# Patient Record
Sex: Female | Born: 1985 | Race: Black or African American | Hispanic: No | Marital: Single | Smoking: Never smoker
Health system: Southern US, Community
[De-identification: ages and names within clinical notes are randomized; demographics above are authoritative.]

## PROBLEM LIST (undated history)

## (undated) ENCOUNTER — Inpatient Hospital Stay (HOSPITAL_COMMUNITY): Payer: Self-pay

## (undated) DIAGNOSIS — I10 Essential (primary) hypertension: Secondary | ICD-10-CM

## (undated) DIAGNOSIS — Z8619 Personal history of other infectious and parasitic diseases: Secondary | ICD-10-CM

## (undated) DIAGNOSIS — O139 Gestational [pregnancy-induced] hypertension without significant proteinuria, unspecified trimester: Secondary | ICD-10-CM

## (undated) HISTORY — PX: NO PAST SURGERIES: SHX2092

## (undated) HISTORY — DX: Personal history of other infectious and parasitic diseases: Z86.19

---
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
## 2014-08-11 ENCOUNTER — Encounter (HOSPITAL_COMMUNITY): Payer: Self-pay | Admitting: Emergency Medicine

## 2014-08-11 ENCOUNTER — Emergency Department (HOSPITAL_COMMUNITY): Payer: Medicaid Other

## 2014-08-11 ENCOUNTER — Emergency Department (HOSPITAL_COMMUNITY)
Admission: EM | Admit: 2014-08-11 | Discharge: 2014-08-11 | Disposition: A | Discharge status: Home / Self Care | Payer: Medicaid Other | Attending: Emergency Medicine | Admitting: Emergency Medicine

## 2014-08-11 DIAGNOSIS — O200 Threatened abortion: Secondary | ICD-10-CM | POA: Diagnosis not present

## 2014-08-11 DIAGNOSIS — Z3A11 11 weeks gestation of pregnancy: Secondary | ICD-10-CM | POA: Diagnosis not present

## 2014-08-11 DIAGNOSIS — N390 Urinary tract infection, site not specified: Secondary | ICD-10-CM

## 2014-08-11 DIAGNOSIS — O2341 Unspecified infection of urinary tract in pregnancy, first trimester: Secondary | ICD-10-CM | POA: Diagnosis not present

## 2014-08-11 DIAGNOSIS — O10011 Pre-existing essential hypertension complicating pregnancy, first trimester: Secondary | ICD-10-CM | POA: Insufficient documentation

## 2014-08-11 DIAGNOSIS — O209 Hemorrhage in early pregnancy, unspecified: Secondary | ICD-10-CM | POA: Insufficient documentation

## 2014-08-11 DIAGNOSIS — N939 Abnormal uterine and vaginal bleeding, unspecified: Secondary | ICD-10-CM

## 2014-08-11 HISTORY — DX: Essential (primary) hypertension: I10

## 2014-08-11 LAB — CBC WITH DIFFERENTIAL/PLATELET
BASOS PCT: 0 % (ref 0–1)
Basophils Absolute: 0 10*3/uL (ref 0.0–0.1)
Eosinophils Absolute: 0.1 10*3/uL (ref 0.0–0.7)
Eosinophils Relative: 1 % (ref 0–5)
HCT: 38.4 % (ref 36.0–46.0)
Hemoglobin: 12.9 g/dL (ref 12.0–15.0)
LYMPHS PCT: 33 % (ref 12–46)
Lymphs Abs: 2.4 10*3/uL (ref 0.7–4.0)
MCH: 28.3 pg (ref 26.0–34.0)
MCHC: 33.6 g/dL (ref 30.0–36.0)
MCV: 84.2 fL (ref 78.0–100.0)
MONOS PCT: 9 % (ref 3–12)
Monocytes Absolute: 0.7 10*3/uL (ref 0.1–1.0)
NEUTROS ABS: 4.3 10*3/uL (ref 1.7–7.7)
NEUTROS PCT: 57 % (ref 43–77)
Platelets: 285 10*3/uL (ref 150–400)
RBC: 4.56 MIL/uL (ref 3.87–5.11)
RDW: 15.7 % — ABNORMAL HIGH (ref 11.5–15.5)
WBC: 7.4 10*3/uL (ref 4.0–10.5)

## 2014-08-11 LAB — COMPREHENSIVE METABOLIC PANEL
ALK PHOS: 38 U/L — AB (ref 39–117)
ALT: 56 U/L — ABNORMAL HIGH (ref 0–35)
ANION GAP: 10 (ref 5–15)
AST: 37 U/L (ref 0–37)
Albumin: 3.5 g/dL (ref 3.5–5.2)
BILIRUBIN TOTAL: 0.5 mg/dL (ref 0.3–1.2)
BUN: 9 mg/dL (ref 6–23)
CHLORIDE: 106 meq/L (ref 96–112)
CO2: 21 mmol/L (ref 19–32)
Calcium: 9.8 mg/dL (ref 8.4–10.5)
Creatinine, Ser: 0.71 mg/dL (ref 0.50–1.10)
GFR calc Af Amer: >90 mL/min (ref >90)
GFR calc non Af Amer: >90 mL/min (ref >90)
Glucose, Bld: 104 mg/dL — ABNORMAL HIGH (ref 70–99)
POTASSIUM: 3.7 mmol/L (ref 3.5–5.1)
Sodium: 137 mmol/L (ref 135–145)
Total Protein: 7.2 g/dL (ref 6.0–8.3)

## 2014-08-11 LAB — URINALYSIS, ROUTINE W REFLEX MICROSCOPIC
GLUCOSE, UA: NEGATIVE mg/dL
KETONES UR: 15 mg/dL — AB
NITRITE: NEGATIVE
Protein, ur: 100 mg/dL — AB
Specific Gravity, Urine: 1.028 (ref 1.005–1.030)
Urobilinogen, UA: 1 mg/dL (ref 0.0–1.0)
pH: 5.5 (ref 5.0–8.0)

## 2014-08-11 LAB — URINE MICROSCOPIC-ADD ON

## 2014-08-11 LAB — RAPID HIV SCREEN (WH-MAU): SUDS RAPID HIV SCREEN: NONREACTIVE

## 2014-08-11 LAB — WET PREP, GENITAL
Trich, Wet Prep: NONE SEEN
YEAST WET PREP: NONE SEEN

## 2014-08-11 LAB — ABO/RH: ABO/RH(D): A POS

## 2014-08-11 MED ORDER — CEPHALEXIN 500 MG PO CAPS: 500.0000 mg | ORAL_CAPSULE | Freq: Four times a day (QID) | ORAL | Status: DC

## 2014-08-11 NOTE — ED Provider Notes (Signed)
CSN: [id number]     Arrival date & time 08/11/14  1928 History   First MD Initiated Contact with Patient 08/11/14 2124     Chief Complaint  Patient presents with  . Vaginal Bleeding     (Consider location/radiation/quality/duration/timing/severity/associated sxs/prior Treatment) Patient is a 28 y.o. female presenting with vaginal bleeding. The history is provided by the patient and medical records.  Vaginal Bleeding   This is a 28 year old G1P0 approx [redacted] weeks gestation, presenting to the ED for vaginal bleeding.  Patient states bleeding began this evening and has been persistent since that time. She denies any passage of clots or membranous tissue. She denies any pelvic trauma. She has been taking prenatal vitamins, but has not had any formal prenatal care up until this point. She was seen at [facility] to have her Medicaid established, but has not yet seen an OB/GYN. She reports some intermittent nausea and vomiting, worse in the morning. States she has been having some issues with constipation due to the prenatal vitamins. She denies any fever or chills. No urinary symptoms. No abnormal vaginal discharge.  Past Medical History  Diagnosis Date  . Hypertension    History reviewed. No pertinent past surgical history. No family history on file. History  Substance Use Topics  . Smoking status: Never Smoker   . Smokeless tobacco: Not on file  . Alcohol Use: Yes   OB History    Gravida Para Term Preterm AB TAB SAB Ectopic Multiple Living   1              Review of Systems  Genitourinary: Positive for vaginal bleeding.  All other systems reviewed and are negative.     Allergies  Review of patient's allergies indicates no known allergies.  Home Medications   Prior to Admission medications   Not on File   BP 132/83 mmHg  Pulse 108  Temp(Src) 97.9 F (36.6 C) (Oral)  Resp 18  SpO2 100%  LMP 05/26/2014   Physical Exam  Constitutional: She is oriented to person,  place, and time. She appears well-developed and well-nourished.  HENT:  Head: Normocephalic and atraumatic.  Mouth/Throat: Oropharynx is clear and moist.  Eyes: Conjunctivae and EOM are normal. Pupils are equal, round, and reactive to light.  Neck: Normal range of motion.  Cardiovascular: Normal rate, regular rhythm and normal heart sounds.   Pulmonary/Chest: Effort normal and breath sounds normal. No respiratory distress. She has no wheezes.  Abdominal: Soft. Bowel sounds are normal. There is no tenderness. There is no guarding.  Genitourinary: There is bleeding in the vagina.  Normal female external genitalia without visible lesions, moderate amount of blood with small clots present in vaginal vault, cervical os remains closed, no adnexal or CMT  Musculoskeletal: Normal range of motion.  Neurological: She is alert and oriented to person, place, and time.  Skin: Skin is warm and dry.  Psychiatric: She has a normal mood and affect.  Nursing note and vitals reviewed.   ED Course  Procedures (including critical care time) Labs Review Labs Reviewed  WET PREP, GENITAL - Abnormal; Notable for the following:    Clue Cells Wet Prep HPF POC FEW (*)    WBC, Wet Prep HPF POC FEW (*)    All other components within normal limits  CBC WITH DIFFERENTIAL - Abnormal; Notable for the following:    RDW 15.7 (*)    All other components within normal limits  COMPREHENSIVE METABOLIC PANEL - Abnormal; Notable for the following:  Glucose, Bld 104 (*)    ALT 56 (*)    Alkaline Phosphatase 38 (*)    All other components within normal limits  URINALYSIS, ROUTINE W REFLEX MICROSCOPIC - Abnormal; Notable for the following:    Color, Urine RED (*)    APPearance CLOUDY (*)    Hgb urine dipstick LARGE (*)    Bilirubin Urine SMALL (*)    Ketones, ur 15 (*)    Protein, ur 100 (*)    Leukocytes, UA SMALL (*)    All other components within normal limits  URINE MICROSCOPIC-ADD ON - Abnormal; Notable for the  following:    Squamous Epithelial / LPF FEW (*)    Bacteria, UA MANY (*)    All other components within normal limits  GC/CHLAMYDIA PROBE AMP  URINE CULTURE  RAPID HIV SCREEN ([facility])  HCG, QUANTITATIVE, PREGNANCY  RPR  ABO/RH    Imaging Review [place] Ob Comp Less 14 Wks  08/11/2014   CLINICAL DATA:  Vaginal bleeding and first trimester pregnancy  EXAM: OBSTETRIC <14 WK US AND TRANSVAGINAL OB US  TECHNIQUE: Both transabdominal and transvaginal ultrasound examinations were performed for complete evaluation of the gestation as well as the maternal uterus, adnexal regions, and pelvic cul-de-sac. Transvaginal technique was performed to assess early pregnancy.  COMPARISON:  None.  FINDINGS: Intrauterine gestational sac: Visualized/normal in shape.  Yolk sac:  Present  Embryo:  Present  Cardiac Activity: Present  Heart Rate:  145 bpm  CRL:   41.9  mm   11 w 0 d                  US EDC: 03/02/2015  Maternal uterus/adnexae: The ovaries are symmetric in size and normal in appearance. No definitive/measurable sub chronic hemorrhage. No free pelvic fluid.  IMPRESSION: Single, living intrauterine gestation with estimated age 27 weeks. No definitive sub chronic hemorrhage.   Electronically Signed   By: Tiburcio PeaJonathan  Watts M.D.   On: 08/11/2014 23:24   [place] Ob Transvaginal  08/11/2014   CLINICAL DATA:  Vaginal bleeding and first trimester pregnancy  EXAM: OBSTETRIC <14 WK US AND TRANSVAGINAL OB US  TECHNIQUE: Both transabdominal and transvaginal ultrasound examinations were performed for complete evaluation of the gestation as well as the maternal uterus, adnexal regions, and pelvic cul-de-sac. Transvaginal technique was performed to assess early pregnancy.  COMPARISON:  None.  FINDINGS: Intrauterine gestational sac: Visualized/normal in shape.  Yolk sac:  Present  Embryo:  Present  Cardiac Activity: Present  Heart Rate:  145 bpm  CRL:   41.9  mm   11 w 0 d                  US EDC: 03/02/2015  Maternal uterus/adnexae:  The ovaries are symmetric in size and normal in appearance. No definitive/measurable sub chronic hemorrhage. No free pelvic fluid.  IMPRESSION: Single, living intrauterine gestation with estimated age 27 weeks. No definitive sub chronic hemorrhage.   Electronically Signed   By: Tiburcio PeaJonathan  Watts M.D.   On: 08/11/2014 23:24     EKG Interpretation None      MDM   Final diagnoses:  Vaginal bleeding  Threatened miscarriage  UTI (lower urinary tract infection)   28 year old G1 P0 approximately [redacted] weeks gestation with vaginal bleeding. No pelvic trauma. On pelvic exam, there is moderate amount of vaginal bleeding with small clots, cervical os remains closed. Lab work obtained as above-- UA appears infectious, culture pending.  Patient A+, not a candidate for rhogam.  U/s obtained-- single live IUP with + cardiac activity.  Patient reassured.  She will be started on keflex for UTI.  Encouraged pelvic rest for the next few days.  [facility]  Discussed plan with patient, he/she acknowledged understanding and agreed with plan of care.  Return precautions given for new or worsening symptoms.  Garlon HatchetLisa M Nancey Kreitz, PA-C 08/11/14 2359  Gilda Creasehristopher J. Pollina, MD 08/12/14 [phone]

## 2014-08-11 NOTE — ED Notes (Signed)
Pt. reports vaginal bleeding onset this evening , denies abdominal pain , pt. stated she is [redacted] weeks pregnant .

## 2014-08-11 NOTE — ED Notes (Signed)
Pt a/o x 4 on d/c with steady gait. 

## 2014-08-11 NOTE — ED Notes (Signed)
Pt reports bleeding and abd pain that started today. Pt rates pain 2/10. States she has bleeding like a menstrual period. Pt states she thinks she is [redacted]wks pregnant.

## 2014-08-11 NOTE — Discharge Instructions (Signed)
Take the prescribed medication as directed for UTI.  Pelvic rest for the next several days.  Monitor bleeding very closely. [facility]-- worsening bleeding, abdominal pain, high fever, etc.

## 2014-08-11 NOTE — ED Notes (Signed)
Patient transported to Ultrasound 

## 2014-08-12 LAB — GC/CHLAMYDIA PROBE AMP
CT Probe RNA: NEGATIVE
GC PROBE AMP APTIMA: NEGATIVE

## 2014-08-12 LAB — HCG, QUANTITATIVE, PREGNANCY: hCG, Beta Chain, Quant, S: 87153 m[IU]/mL — ABNORMAL HIGH (ref <5)

## 2014-08-12 LAB — RPR

## 2014-08-13 LAB — URINE CULTURE

## 2014-08-15 NOTE — L&D Delivery Note (Signed)
Delivery Note At 1:42 PM a viable female was delivered via  (Presentation: OA).  APGAR:8 , 9; weight pending .   Placenta status: delivered spontaneously.   Cord:  with the following complications: none .    Anesthesia: Epidural  Episiotomy:  none Lacerations:  Small first degree Suture Repair: 3.0 vicryl rapide for hemostasis Est. Blood Loss (mL):  250cc  Mom to postpartum.  Baby to Couplet care / Skin to Skin.  Oliver PilaICHARDSON,Sophiamarie Nease W 02/19/2015, 1:59 PM

## 2014-10-03 LAB — OB RESULTS CONSOLE ABO/RH: RH Type: POSITIVE

## 2014-10-03 LAB — OB RESULTS CONSOLE RUBELLA ANTIBODY, IGM: Rubella: IMMUNE

## 2014-10-03 LAB — OB RESULTS CONSOLE ANTIBODY SCREEN: Antibody Screen: NEGATIVE

## 2014-10-03 LAB — OB RESULTS CONSOLE GC/CHLAMYDIA
Chlamydia: NEGATIVE
Gonorrhea: NEGATIVE

## 2014-10-03 LAB — OB RESULTS CONSOLE HEPATITIS B SURFACE ANTIGEN: Hepatitis B Surface Ag: NEGATIVE

## 2014-10-03 LAB — OB RESULTS CONSOLE HIV ANTIBODY (ROUTINE TESTING): HIV: NONREACTIVE

## 2014-10-03 LAB — OB RESULTS CONSOLE RPR: RPR: NONREACTIVE

## 2015-01-30 ENCOUNTER — Encounter (HOSPITAL_COMMUNITY): Payer: Self-pay | Admitting: *Deleted

## 2015-01-30 ENCOUNTER — Inpatient Hospital Stay (HOSPITAL_COMMUNITY)
Admission: AD | Admit: 2015-01-30 | Discharge: 2015-01-30 | Disposition: A | Discharge status: Home / Self Care | Payer: Medicaid Other | Source: Ambulatory Visit | Attending: Obstetrics and Gynecology | Admitting: Obstetrics and Gynecology

## 2015-01-30 DIAGNOSIS — Z3A35 35 weeks gestation of pregnancy: Secondary | ICD-10-CM | POA: Diagnosis not present

## 2015-01-30 DIAGNOSIS — O133 Gestational [pregnancy-induced] hypertension without significant proteinuria, third trimester: Secondary | ICD-10-CM | POA: Diagnosis not present

## 2015-01-30 DIAGNOSIS — R030 Elevated blood-pressure reading, without diagnosis of hypertension: Secondary | ICD-10-CM | POA: Diagnosis present

## 2015-01-30 LAB — COMPREHENSIVE METABOLIC PANEL
ALT: 11 U/L — ABNORMAL LOW (ref 14–54)
AST: 17 U/L (ref 15–41)
Albumin: 2.6 g/dL — ABNORMAL LOW (ref 3.5–5.0)
Alkaline Phosphatase: 84 U/L (ref 38–126)
Anion gap: 6 (ref 5–15)
BUN: 8 mg/dL (ref 6–20)
CO2: 21 mmol/L — ABNORMAL LOW (ref 22–32)
Calcium: 9.1 mg/dL (ref 8.9–10.3)
Chloride: 107 mmol/L (ref 101–111)
Creatinine, Ser: 0.54 mg/dL (ref 0.44–1.00)
GFR calc Af Amer: >60 mL/min (ref >60)
GFR calc non Af Amer: >60 mL/min (ref >60)
Glucose, Bld: 116 mg/dL — ABNORMAL HIGH (ref 65–99)
Potassium: 3.9 mmol/L (ref 3.5–5.1)
Sodium: 134 mmol/L — ABNORMAL LOW (ref 135–145)
Total Bilirubin: 0.4 mg/dL (ref 0.3–1.2)
Total Protein: 6.4 g/dL — ABNORMAL LOW (ref 6.5–8.1)

## 2015-01-30 LAB — PROTEIN / CREATININE RATIO, URINE
Creatinine, Urine: 400 mg/dL
Protein Creatinine Ratio: 0.1 mg/mg{Cre} (ref 0.00–0.15)
Total Protein, Urine: 40 mg/dL

## 2015-01-30 LAB — CBC
HCT: 33.2 % — ABNORMAL LOW (ref 36.0–46.0)
Hemoglobin: 11.2 g/dL — ABNORMAL LOW (ref 12.0–15.0)
MCH: 28.9 pg (ref 26.0–34.0)
MCHC: 33.7 g/dL (ref 30.0–36.0)
MCV: 85.8 fL (ref 78.0–100.0)
Platelets: 260 10*3/uL (ref 150–400)
RBC: 3.87 MIL/uL (ref 3.87–5.11)
RDW: 14.1 % (ref 11.5–15.5)
WBC: 7.6 10*3/uL (ref 4.0–10.5)

## 2015-01-30 LAB — OB RESULTS CONSOLE GBS: GBS: NEGATIVE

## 2015-01-30 LAB — LACTATE DEHYDROGENASE: LDH: 98 U/L (ref 98–192)

## 2015-01-30 LAB — URIC ACID: Uric Acid, Serum: 4.5 mg/dL (ref 2.3–6.6)

## 2015-01-30 NOTE — MAU Note (Signed)
Pt sent from Dr's office, BP elevated. Denies HA, visual changes, epigastric pain or increase in swelling.  Pain in vag area, has been sore, exam was uncomfortable

## 2015-01-30 NOTE — Discharge Instructions (Signed)
Preeclampsia and Eclampsia °Preeclampsia is a serious condition that develops only during pregnancy. It is also called toxemia of pregnancy. This condition causes high blood pressure along with other symptoms, such as swelling and headaches. These may develop as the condition gets worse. Preeclampsia may occur 20 weeks or later into your pregnancy.  °Diagnosing and treating preeclampsia early is very important. If not treated early, it can cause serious problems for you and your baby. One problem it can lead to is eclampsia, which is a condition that causes muscle jerking or shaking (convulsions) in the mother. Delivering your baby is the best treatment for preeclampsia or eclampsia.  °RISK FACTORS °The cause of preeclampsia is not known. You may be more likely to develop preeclampsia if you have certain risk factors. These include:  °· Being pregnant for the first time. °· Having preeclampsia in a past pregnancy. °· Having a family history of preeclampsia. °· Having high blood pressure. °· Being pregnant with twins or triplets. °· Being 35 or older. °· Being African American. °· Having kidney disease or diabetes. °· Having medical conditions such as lupus or blood diseases. °· Being very overweight (obese). °SIGNS AND SYMPTOMS  °The earliest signs of preeclampsia are: °· High blood pressure. °· Increased protein in your urine. Your health care provider will check for this at every prenatal visit. °Other symptoms that can develop include:  °· Severe headaches. °· Sudden weight gain. °· Swelling of your hands, face, legs, and feet. °· Feeling sick to your stomach (nauseous) and throwing up (vomiting). °· Vision problems (blurred or double vision). °· Numbness in your face, arms, legs, and feet. °· Dizziness. °· Slurred speech. °· Sensitivity to bright lights. °· Abdominal pain. °DIAGNOSIS  °There are no screening tests for preeclampsia. Your health care provider will ask you about symptoms and check for signs of  preeclampsia during your prenatal visits. You may also have tests, including: °· Urine testing. °· Blood testing. °· Checking your baby's heart rate. °· Checking the health of your baby and your placenta using images created with sound waves (ultrasound). °TREATMENT  °You can work out the best treatment approach together with your health care provider. It is very important to keep all prenatal appointments. If you have an increased risk of preeclampsia, you may need more frequent prenatal exams. °· Your health care provider may prescribe bed rest. °· You may have to eat as little salt as possible. °· You may need to take medicine to lower your blood pressure if the condition does not respond to more conservative measures. °· You may need to stay in the hospital if your condition is severe. There, treatment will focus on controlling your blood pressure and fluid retention. You may also need to take medicine to prevent seizures. °· If the condition gets worse, your baby may need to be delivered early to protect you and the baby. You may have your labor started with medicine (be induced), or you may have a cesarean delivery. °· Preeclampsia usually goes away after the baby is born. °HOME CARE INSTRUCTIONS  °· Only take over-the-counter or prescription medicines as directed by your health care provider. °· Lie on your left side while resting. This keeps pressure off your baby. °· Elevate your feet while resting. °· Get regular exercise. Ask your health care provider what type of exercise is safe for you. °· Avoid caffeine and alcohol. °· Do not smoke. °· Drink 6-8 glasses of water every day. °· Eat a balanced diet   that is low in salt. Do not add salt to your food. °· Avoid stressful situations as much as possible. °· Get plenty of rest and sleep. °· Keep all prenatal appointments and tests as scheduled. °SEEK MEDICAL CARE IF: °· You are gaining more weight than expected. °· You have any headaches, abdominal pain, or  nausea. °· You are bruising more than usual. °· You feel dizzy or light-headed. °SEEK IMMEDIATE MEDICAL CARE IF:  °· You develop sudden or severe swelling anywhere in your body. This usually happens in the legs. °· You gain 5 lb (2.3 kg) or more in a week. °· You have a severe headache, dizziness, problems with your vision, or confusion. °· You have severe abdominal pain. °· You have lasting nausea or vomiting. °· You have a seizure. °· You have trouble moving any part of your body. °· You develop numbness in your body. °· You have trouble speaking. °· You have any abnormal bleeding. °· You develop a stiff neck. °· You pass out. °MAKE SURE YOU:  °· Understand these instructions. °· Will watch your condition. °· Will get help right away if you are not doing well or get worse. °Document Released: 07/29/2000 Document Revised: 08/06/2013 Document Reviewed: 05/24/2013 °ExitCare® Patient Information ©2015 [employer]. This information is not intended to replace advice given to you by your health care provider. Make sure you discuss any questions you have with your health care provider. ° ° ° °Hypertension During Pregnancy °Hypertension is also called high blood pressure. Blood pressure moves blood in your body. Sometimes, the force that moves the blood becomes too strong. When you are pregnant, this condition should be watched carefully. It can cause problems for you and your baby. °HOME CARE  °· Make and keep all of your doctor visits. °· Take medicine as told by your doctor. Tell your doctor about all medicines you take. °· Eat very little salt. °· Exercise regularly. °· Do not drink alcohol. °· Do not smoke. °· Do not have drinks with caffeine. °· Lie on your left side when resting. °· Your health care provider may ask you to take one low-dose aspirin (81mg) each day. °GET HELP RIGHT AWAY IF: °· You have bad belly (abdominal) pain. °· You have sudden puffiness (swelling) in the hands, ankles, or face. °· You gain 4  pounds (1.8 kilograms) or more in 1 week. °· You throw up (vomit) repeatedly. °· You have bleeding from the vagina. °· You do not feel the baby moving as much. °· You have a headache. °· You have blurred or double vision. °· You have muscle twitching or spasms. °· You have shortness of breath. °· You have blue fingernails and lips. °· You have blood in your pee (urine). °MAKE SURE YOU: °· Understand these instructions. °· Will watch your condition. °· Will get help right away if you are not doing well or get worse. °Document Released: 09/03/2010 Document Revised: 12/16/2013 Document Reviewed: 02/28/2013 °ExitCare® Patient Information ©2015 [employer]. This information is not intended to replace advice given to you by your health care provider. Make sure you discuss any questions you have with your health care provider. ° °

## 2015-01-30 NOTE — Progress Notes (Signed)
Patient refuses cath for urine sample.

## 2015-01-30 NOTE — MAU Note (Addendum)
Was seen at MD office and BP was elevated. Was sent to MAU for further evaluation. States she has not felt FM since 1130 today. C/O sharp pains in lower abdomen and suprapubic area with movement and change of position.

## 2015-01-30 NOTE — MAU Provider Note (Signed)
History     CSN: [id number]  Arrival date and time: 01/30/15 1314   First Provider Initiated Contact with Patient 01/30/15 1408      Chief Complaint  Patient presents with  . Hypertension   Hypertension This is a recurrent problem. The current episode started today. The problem is unchanged. Associated symptoms include anxiety and headaches. Pertinent negatives include no blurred vision or peripheral edema.  Headache  This is a recurrent problem. The current episode started 1 to 4 weeks ago. The problem occurs intermittently. The problem has been waxing and waning. The pain is located in the occipital region. The pain does not radiate. The pain quality is similar to prior headaches. The quality of the pain is described as dull. The pain is at a severity of 3/10. The pain is mild. Pertinent negatives include no abdominal pain, blurred vision, nausea, photophobia or vomiting. She has tried acetaminophen for the symptoms. The treatment provided no relief. There is no history of hypertension.    29 y.o.  G1P0  presents to the MAU from the office with elevated blood pressure. Here for preeclamptic evaluation.  Past Medical History  Diagnosis Date  . Hypertension     History reviewed. No pertinent past surgical history.  History reviewed. No pertinent family history.  History  Substance Use Topics  . Smoking status: Never Smoker   . Smokeless tobacco: Not on file  . Alcohol Use: Yes     Comment: occas. prior to preg.    Allergies: No Known Allergies  Prescriptions prior to admission  Medication Sig Dispense Refill Last Dose  . Prenatal Vit-Fe Fumarate-FA (PRENATAL MULTIVITAMIN) TABS tablet Take 1 tablet by mouth daily at 12 noon.   Past Week at Unknown time    Review of Systems  Eyes: Negative for blurred vision, double vision and photophobia.  Gastrointestinal: Negative for nausea, vomiting and abdominal pain.  Neurological: Positive for headaches.   Psychiatric/Behavioral: The patient is nervous/anxious.    Physical Exam   Blood pressure 149/96, pulse 119, temperature 98.2 F (36.8 C), temperature source Oral, resp. rate 18, last menstrual period 05/26/2014.  Physical Exam  Nursing note and vitals reviewed. Constitutional: She is oriented to person, place, and time. She appears well-developed and well-nourished. No distress.  HENT:  Head: Normocephalic.  Neck: Normal range of motion. No thyromegaly present.  Cardiovascular: Normal rate.   Respiratory: Effort normal. No respiratory distress.  GI: Soft. There is no tenderness.  Musculoskeletal: Normal range of motion. She exhibits no edema.  Neurological: She is alert and oriented to person, place, and time. She has normal reflexes. She displays normal reflexes.  Skin: Skin is warm and dry.  Psychiatric: She has a normal mood and affect. Her behavior is normal. Judgment and thought content normal.   Results for orders placed or performed during the hospital encounter of 01/30/15 (from the past 24 hour(s))  CBC     Status: Abnormal   Collection Time: 01/30/15  2:25 PM  Result Value Ref Range   WBC 7.6 4.0 - 10.5 K/uL   RBC 3.87 3.87 - 5.11 MIL/uL   Hemoglobin 11.2 (L) 12.0 - 15.0 g/dL   HCT 40.9 (L) 81.1 - 91.4 %   MCV 85.8 78.0 - 100.0 fL   MCH 28.9 26.0 - 34.0 pg   MCHC 33.7 30.0 - 36.0 g/dL   RDW 78.2 95.6 - 21.3 %   Platelets 260 150 - 400 K/uL  Comprehensive metabolic panel     Status: Abnormal  Collection Time: 01/30/15  2:25 PM  Result Value Ref Range   Sodium 134 (L) 135 - 145 mmol/L   Potassium 3.9 3.5 - 5.1 mmol/L   Chloride 107 101 - 111 mmol/L   CO2 21 (L) 22 - 32 mmol/L   Glucose, Bld 116 (H) 65 - 99 mg/dL   BUN 8 6 - 20 mg/dL   Creatinine, Ser 1.61 0.44 - 1.00 mg/dL   Calcium 9.1 8.9 - 09.6 mg/dL   Total Protein 6.4 (L) 6.5 - 8.1 g/dL   Albumin 2.6 (L) 3.5 - 5.0 g/dL   AST 17 15 - 41 U/L   ALT 11 (L) 14 - 54 U/L   Alkaline Phosphatase 84 38 - 126  U/L   Total Bilirubin 0.4 0.3 - 1.2 mg/dL   GFR calc non Af Amer >60 >60 mL/min   GFR calc Af Amer >60 >60 mL/min   Anion gap 6 5 - 15  Lactate dehydrogenase     Status: None   Collection Time: 01/30/15  2:25 PM  Result Value Ref Range   LDH 98 98 - 192 U/L  Uric acid     Status: None   Collection Time: 01/30/15  2:25 PM  Result Value Ref Range   Uric Acid, Serum 4.5 2.3 - 6.6 mg/dL  Protein / creatinine ratio, urine     Status: None   Collection Time: 01/30/15  2:35 PM  Result Value Ref Range   Creatinine, Urine 400.00 mg/dL   Total Protein, Urine 40 mg/dL   Protein Creatinine Ratio 0.10 0.00 - 0.15 mg/mg[Cre]   01/30/15 1541  --  115  --  --  138/88 mmHg  --  --  --  -- [place]     01/30/15 1531  --  111  --  --  141/89 mmHg  --  --  --  -- [place]    01/30/15 1521  --  113  --  --  140/89 mmHg  --  --  --  -- [place]    01/30/15 1511  --  108  --  --  150/96 mmHg  --  --  --  -- [place]    01/30/15 1501  --  105  --  --  143/93 mmHg  --  --  --  -- [place]    01/30/15 1451  --  116  --  --  136/89 mmHg  --  --  --  -- [place]    01/30/15 1441  --  114  --  --  149/95 mmHg  --  --  --  -- [place]    01/30/15 1421  --  119  --  --  140/96 mmHg  --  --  --  -- [place]    01/30/15 1411  --  120  --  --  151/96 mmHg  --  --  --  -- [place]    01/30/15 1401  --  119  --  --  149/96 mmHg  --  --  --  -- [place]    01/30/15 1359  98.2 F (36.8 C)  116  --  18  152/99 mmHg  --  --  --  -- [place]           MAU Course  Procedures  MDM Pt is here for serial blood pressures and labs. Pt has refused to have cath urine collected but will urinate in a cup. Spoke to Dr Ambrose Mantle and pt will be discharged but will  need to start biweekly testing. She will need to schedule an appointment next Monday or Tuesday. FHR reactive, not affected by current problem  Assessment and Plan  Gestational Hypertension  Discharge to home Pre E Precautions  Clemmons,Lori Grissett 01/30/2015, 2:11 PM

## 2015-02-13 ENCOUNTER — Encounter (HOSPITAL_COMMUNITY): Payer: Self-pay | Admitting: *Deleted

## 2015-02-13 ENCOUNTER — Telehealth (HOSPITAL_COMMUNITY): Payer: Self-pay | Admitting: *Deleted

## 2015-02-13 NOTE — Telephone Encounter (Signed)
Preadmission screen  

## 2015-02-16 ENCOUNTER — Inpatient Hospital Stay (HOSPITAL_COMMUNITY)
Admit: 2015-02-16 | Discharge: 2015-02-16 | Disposition: A | Discharge status: Home / Self Care | Payer: Medicaid Other | Source: Ambulatory Visit | Attending: Obstetrics and Gynecology | Admitting: Obstetrics and Gynecology

## 2015-02-16 DIAGNOSIS — Z3A38 38 weeks gestation of pregnancy: Secondary | ICD-10-CM | POA: Diagnosis not present

## 2015-02-16 DIAGNOSIS — O1493 Unspecified pre-eclampsia, third trimester: Secondary | ICD-10-CM | POA: Insufficient documentation

## 2015-02-16 NOTE — MAU Provider Note (Signed)
NST 130 R, good variability, category 1

## 2015-02-18 ENCOUNTER — Inpatient Hospital Stay (HOSPITAL_COMMUNITY): Payer: Medicaid Other | Admitting: Anesthesiology

## 2015-02-18 ENCOUNTER — Inpatient Hospital Stay (HOSPITAL_COMMUNITY)
Admission: AD | Admit: 2015-02-18 | Discharge: 2015-02-21 | DRG: 774 | Disposition: A | Discharge status: Home / Self Care | Payer: Medicaid Other | Source: Ambulatory Visit | Attending: Obstetrics and Gynecology | Admitting: Obstetrics and Gynecology

## 2015-02-18 ENCOUNTER — Encounter (HOSPITAL_COMMUNITY): Payer: Self-pay

## 2015-02-18 DIAGNOSIS — O99214 Obesity complicating childbirth: Secondary | ICD-10-CM | POA: Diagnosis present

## 2015-02-18 DIAGNOSIS — Z8249 Family history of ischemic heart disease and other diseases of the circulatory system: Secondary | ICD-10-CM | POA: Diagnosis not present

## 2015-02-18 DIAGNOSIS — O1493 Unspecified pre-eclampsia, third trimester: Principal | ICD-10-CM | POA: Diagnosis present

## 2015-02-18 DIAGNOSIS — Z3A38 38 weeks gestation of pregnancy: Secondary | ICD-10-CM | POA: Diagnosis present

## 2015-02-18 DIAGNOSIS — Z6836 Body mass index (BMI) 36.0-36.9, adult: Secondary | ICD-10-CM

## 2015-02-18 DIAGNOSIS — O1400 Mild to moderate pre-eclampsia, unspecified trimester: Secondary | ICD-10-CM | POA: Diagnosis present

## 2015-02-18 DIAGNOSIS — Z833 Family history of diabetes mellitus: Secondary | ICD-10-CM | POA: Diagnosis not present

## 2015-02-18 LAB — COMPREHENSIVE METABOLIC PANEL
ALK PHOS: 104 U/L (ref 38–126)
ALT: 12 U/L — ABNORMAL LOW (ref 14–54)
AST: 23 U/L (ref 15–41)
Albumin: 2.7 g/dL — ABNORMAL LOW (ref 3.5–5.0)
Anion gap: 6 (ref 5–15)
BUN: 13 mg/dL (ref 6–20)
CHLORIDE: 110 mmol/L (ref 101–111)
CO2: 19 mmol/L — ABNORMAL LOW (ref 22–32)
Calcium: 9 mg/dL (ref 8.9–10.3)
Creatinine, Ser: 0.58 mg/dL (ref 0.44–1.00)
GFR calc non Af Amer: >60 mL/min (ref >60)
GLUCOSE: 96 mg/dL (ref 65–99)
Potassium: 4.2 mmol/L (ref 3.5–5.1)
SODIUM: 135 mmol/L (ref 135–145)
TOTAL PROTEIN: 6.3 g/dL — AB (ref 6.5–8.1)
Total Bilirubin: 0.5 mg/dL (ref 0.3–1.2)

## 2015-02-18 LAB — ABO/RH: ABO/RH(D): A POS

## 2015-02-18 LAB — CBC
HCT: 34.1 % — ABNORMAL LOW (ref 36.0–46.0)
HEMOGLOBIN: 11.3 g/dL — AB (ref 12.0–15.0)
MCH: 28.8 pg (ref 26.0–34.0)
MCHC: 33.1 g/dL (ref 30.0–36.0)
MCV: 86.8 fL (ref 78.0–100.0)
Platelets: 258 10*3/uL (ref 150–400)
RBC: 3.93 MIL/uL (ref 3.87–5.11)
RDW: 15 % (ref 11.5–15.5)
WBC: 9.7 10*3/uL (ref 4.0–10.5)

## 2015-02-18 LAB — TYPE AND SCREEN
ABO/RH(D): A POS
Antibody Screen: NEGATIVE

## 2015-02-18 MED ORDER — OXYCODONE-ACETAMINOPHEN 5-325 MG PO TABS: 2.0000 | ORAL_TABLET | ORAL | Status: DC | PRN

## 2015-02-18 MED ORDER — FENTANYL 2.5 MCG/ML BUPIVACAINE 1/10 % EPIDURAL INFUSION (WH - ANES)
14.0000 mL/h | INTRAMUSCULAR | Status: DC | PRN
  Administered 2015-02-18 – 2015-02-19 (×2): 14 mL/h via EPIDURAL
  Administered 2015-02-19: 12 mL/h via EPIDURAL
  Filled 2015-02-18 (×2): qty 125

## 2015-02-18 MED ORDER — OXYTOCIN 40 UNITS IN LACTATED RINGERS INFUSION - SIMPLE MED: 62.5000 mL/h | INTRAVENOUS | Status: DC

## 2015-02-18 MED ORDER — OXYCODONE-ACETAMINOPHEN 5-325 MG PO TABS: 1.0000 | ORAL_TABLET | ORAL | Status: DC | PRN

## 2015-02-18 MED ORDER — EPHEDRINE 5 MG/ML INJ
10.0000 mg | INTRAVENOUS | Status: DC | PRN
  Filled 2015-02-18: qty 2

## 2015-02-18 MED ORDER — LIDOCAINE HCL (PF) 1 % IJ SOLN
30.0000 mL | INTRAMUSCULAR | Status: DC | PRN
  Filled 2015-02-18: qty 30

## 2015-02-18 MED ORDER — ACETAMINOPHEN 325 MG PO TABS: 650.0000 mg | ORAL_TABLET | ORAL | Status: DC | PRN

## 2015-02-18 MED ORDER — PHENYLEPHRINE 40 MCG/ML (10ML) SYRINGE FOR IV PUSH (FOR BLOOD PRESSURE SUPPORT)
80.0000 ug | PREFILLED_SYRINGE | INTRAVENOUS | Status: DC | PRN
  Filled 2015-02-18: qty 2

## 2015-02-18 MED ORDER — ONDANSETRON HCL 4 MG/2ML IJ SOLN: 4.0000 mg | Freq: Four times a day (QID) | INTRAMUSCULAR | Status: DC | PRN

## 2015-02-18 MED ORDER — LABETALOL HCL 5 MG/ML IV SOLN
INTRAVENOUS | Status: AC
  Administered 2015-02-18: 20 mg via INTRAVENOUS
  Filled 2015-02-18: qty 4

## 2015-02-18 MED ORDER — TERBUTALINE SULFATE 1 MG/ML IJ SOLN: 0.2500 mg | Freq: Once | INTRAMUSCULAR | Status: AC | PRN

## 2015-02-18 MED ORDER — OXYTOCIN BOLUS FROM INFUSION
500.0000 mL | INTRAVENOUS | Status: DC
  Administered 2015-02-19: 500 mL via INTRAVENOUS

## 2015-02-18 MED ORDER — PHENYLEPHRINE 40 MCG/ML (10ML) SYRINGE FOR IV PUSH (FOR BLOOD PRESSURE SUPPORT)
PREFILLED_SYRINGE | INTRAVENOUS | Status: AC
  Filled 2015-02-18: qty 20

## 2015-02-18 MED ORDER — BUTORPHANOL TARTRATE 1 MG/ML IJ SOLN: 1.0000 mg | INTRAMUSCULAR | Status: DC | PRN

## 2015-02-18 MED ORDER — FENTANYL 2.5 MCG/ML BUPIVACAINE 1/10 % EPIDURAL INFUSION (WH - ANES)
INTRAMUSCULAR | Status: AC
  Administered 2015-02-19: 12 mL/h via EPIDURAL
  Filled 2015-02-18: qty 125

## 2015-02-18 MED ORDER — LACTATED RINGERS IV SOLN: 500.0000 mL | INTRAVENOUS | Status: DC | PRN

## 2015-02-18 MED ORDER — LACTATED RINGERS IV SOLN
INTRAVENOUS | Status: DC
  Administered 2015-02-18: 125 mL/h via INTRAVENOUS
  Administered 2015-02-19: 01:00:00 via INTRAVENOUS
  Administered 2015-02-19: 125 mL/h via INTRAVENOUS

## 2015-02-18 MED ORDER — DIPHENHYDRAMINE HCL 50 MG/ML IJ SOLN: 12.5000 mg | INTRAMUSCULAR | Status: DC | PRN

## 2015-02-18 MED ORDER — OXYTOCIN 40 UNITS IN LACTATED RINGERS INFUSION - SIMPLE MED
1.0000 m[IU]/min | INTRAVENOUS | Status: DC
  Administered 2015-02-19: 2 m[IU]/min via INTRAVENOUS
  Filled 2015-02-18: qty 1000

## 2015-02-18 MED ORDER — CITRIC ACID-SODIUM CITRATE 334-500 MG/5ML PO SOLN: 30.0000 mL | ORAL | Status: DC | PRN

## 2015-02-18 MED ORDER — LABETALOL HCL 5 MG/ML IV SOLN
20.0000 mg | INTRAVENOUS | Status: DC | PRN
Start: 2015-02-18 — End: 2015-02-19
  Administered 2015-02-18: 20 mg via INTRAVENOUS

## 2015-02-18 NOTE — Progress Notes (Signed)
Assumed care of pt from liz Cone,RN pt sitting up for an epidural

## 2015-02-18 NOTE — H&P (Signed)
Cassandra Rich is a 29 y.o. female G1P0 at 5538 2/7 weeks (EDD7/18/16 by LMP c/w 11 week US) presenting for labor evaluation --pt is for induction of labor later this evening with an induction spot for midnight.   Prenatal care complicated by gestational hypertension beginning at 35 weeks.  She has been followed closely with labs and NST's.  An initial 24 hour urine done 2 weeks ago was normal at 224 mg protein in 24 hours.  However she repeated the urine 7/1 and it returned 449mg  protein/24 hours c/w mild preeclampsia.  BP has remained elevated at 130-150/70-90.  She has no PIH sx. Blood work and NST's have remained normal.   She started care late with our office at 17 weeks but otherwise had an uncomplicated course    Maternal Medical History:  Reason for admission: Contractions.   Contractions: Onset was 1-2 hours ago.   Frequency: regular.   Perceived severity is moderate.    Fetal activity: Perceived fetal activity is normal.    Prenatal complications: PIH.   Prenatal Complications - Diabetes: none.    OB History    Gravida Para Term Preterm AB TAB SAB Ectopic Multiple Living   1              Past Medical History  Diagnosis Date  . Hypertension   . Hx of chlamydia infection    Past Surgical History  Procedure Laterality Date  . No past surgeries     Family History: family history includes Diabetes in her father; Hypertension in her mother. Social History:  reports that she has never smoked. She has never used smokeless tobacco. She reports that she drinks alcohol. She reports that she does not use illicit drugs.   Prenatal Transfer Tool  Maternal Diabetes: No Genetic Screening: Normal Maternal Ultrasounds/Referrals: Normal Fetal Ultrasounds or other Referrals:  None Maternal Substance Abuse:  No Significant Maternal Medications:  None Significant Maternal Lab Results:  None Other Comments:  None  Review of Systems  Gastrointestinal: Negative for abdominal pain.   Neurological: Negative for headaches.      Last menstrual period 05/26/2014. Maternal Exam:  Uterine Assessment: Contraction strength is moderate.  Contraction frequency is regular.   Abdomen: Fetal presentation: vertex  Introitus: Normal vulva. Normal vagina.    Physical Exam  Constitutional: She appears well-developed.  Cardiovascular: Normal rate and regular rhythm.   Respiratory: Effort normal and breath sounds normal.  GI: Soft.  Genitourinary: Vagina normal.  Neurological: She is alert.  Psychiatric: She has a normal mood and affect.    Prenatal labs: ABO, Rh: A/Positive/-- (02/19 0000) Antibody: Negative (02/19 0000) Rubella: Immune (02/19 0000) RPR: Nonreactive (02/19 0000)  HBsAg: Negative (02/19 0000)  HIV: Non-reactive (02/19 0000)  GBS: Negative (06/17 0000)  One hour GTT 16 Three hour GTT WNL Tetra screen WNL CF negative  Assessment/Plan: Pt presents for labor evaluation and is for IOL later this PM for her mild preeclampsia.  WIll assess her cervical change and possibly admit early and augment.  Will recheck [facility] labs when admitted.  Oliver PilaICHARDSON,Cassandra Balfour W 02/18/2015, 8:07 PM

## 2015-02-18 NOTE — MAU Note (Signed)
Contractions every 5 mins. States some LOF, denies vag bleeding. +FM.

## 2015-02-19 ENCOUNTER — Inpatient Hospital Stay (HOSPITAL_COMMUNITY): Admission: RE | Admit: 2015-02-19 | Payer: Medicaid Other | Source: Ambulatory Visit

## 2015-02-19 ENCOUNTER — Encounter (HOSPITAL_COMMUNITY): Payer: Self-pay

## 2015-02-19 LAB — RPR: RPR Ser Ql: NONREACTIVE

## 2015-02-19 LAB — CBC WITH DIFFERENTIAL/PLATELET
BASOS ABS: 0 10*3/uL (ref 0.0–0.1)
Basophils Relative: 0 % (ref 0–1)
Eosinophils Absolute: 0 10*3/uL (ref 0.0–0.7)
Eosinophils Relative: 0 % (ref 0–5)
HEMATOCRIT: 34.2 % — AB (ref 36.0–46.0)
Hemoglobin: 11.4 g/dL — ABNORMAL LOW (ref 12.0–15.0)
LYMPHS PCT: 5 % — AB (ref 12–46)
Lymphs Abs: 0.9 10*3/uL (ref 0.7–4.0)
MCH: 28.9 pg (ref 26.0–34.0)
MCHC: 33.3 g/dL (ref 30.0–36.0)
MCV: 86.8 fL (ref 78.0–100.0)
Monocytes Absolute: 1.8 10*3/uL — ABNORMAL HIGH (ref 0.1–1.0)
Monocytes Relative: 9 % (ref 3–12)
NEUTROS ABS: 16.1 10*3/uL — AB (ref 1.7–7.7)
NEUTROS PCT: 86 % — AB (ref 43–77)
Platelets: 230 10*3/uL (ref 150–400)
RBC: 3.94 MIL/uL (ref 3.87–5.11)
RDW: 15.1 % (ref 11.5–15.5)
WBC: 18.8 10*3/uL — AB (ref 4.0–10.5)

## 2015-02-19 MED ORDER — ONDANSETRON HCL 4 MG/2ML IJ SOLN: 4.0000 mg | INTRAMUSCULAR | Status: DC | PRN

## 2015-02-19 MED ORDER — ACETAMINOPHEN 325 MG PO TABS: 650.0000 mg | ORAL_TABLET | ORAL | Status: DC | PRN

## 2015-02-19 MED ORDER — OXYCODONE-ACETAMINOPHEN 5-325 MG PO TABS
1.0000 | ORAL_TABLET | ORAL | Status: DC | PRN
  Administered 2015-02-19 – 2015-02-20 (×3): 1 via ORAL
  Filled 2015-02-19 (×3): qty 1

## 2015-02-19 MED ORDER — DIPHENHYDRAMINE HCL 25 MG PO CAPS: 25.0000 mg | ORAL_CAPSULE | Freq: Four times a day (QID) | ORAL | Status: DC | PRN

## 2015-02-19 MED ORDER — WITCH HAZEL-GLYCERIN EX PADS: 1.0000 "application " | MEDICATED_PAD | CUTANEOUS | Status: DC | PRN

## 2015-02-19 MED ORDER — PRENATAL MULTIVITAMIN CH
1.0000 | ORAL_TABLET | Freq: Every day | ORAL | Status: DC
  Administered 2015-02-20: 1 via ORAL
  Filled 2015-02-19: qty 1

## 2015-02-19 MED ORDER — TETANUS-DIPHTH-ACELL PERTUSSIS 5-2.5-18.5 LF-MCG/0.5 IM SUSP: 0.5000 mL | Freq: Once | INTRAMUSCULAR | Status: DC

## 2015-02-19 MED ORDER — BUPIVACAINE HCL (PF) 0.25 % IJ SOLN
INTRAMUSCULAR | Status: DC | PRN
  Administered 2015-02-18 (×2): 4 mL

## 2015-02-19 MED ORDER — ONDANSETRON HCL 4 MG PO TABS: 4.0000 mg | ORAL_TABLET | ORAL | Status: DC | PRN

## 2015-02-19 MED ORDER — BENZOCAINE-MENTHOL 20-0.5 % EX AERO: 1.0000 "application " | INHALATION_SPRAY | CUTANEOUS | Status: DC | PRN

## 2015-02-19 MED ORDER — LIDOCAINE-EPINEPHRINE (PF) 2 %-1:200000 IJ SOLN
INTRAMUSCULAR | Status: DC | PRN
  Administered 2015-02-18: 3 mL

## 2015-02-19 MED ORDER — IBUPROFEN 600 MG PO TABS
600.0000 mg | ORAL_TABLET | Freq: Four times a day (QID) | ORAL | Status: DC
  Administered 2015-02-19 – 2015-02-21 (×6): 600 mg via ORAL
  Filled 2015-02-19 (×7): qty 1

## 2015-02-19 MED ORDER — ZOLPIDEM TARTRATE 5 MG PO TABS: 5.0000 mg | ORAL_TABLET | Freq: Every evening | ORAL | Status: DC | PRN

## 2015-02-19 MED ORDER — DIBUCAINE 1 % RE OINT: 1.0000 "application " | TOPICAL_OINTMENT | RECTAL | Status: DC | PRN

## 2015-02-19 MED ORDER — OXYCODONE-ACETAMINOPHEN 5-325 MG PO TABS: 2.0000 | ORAL_TABLET | ORAL | Status: DC | PRN

## 2015-02-19 MED ORDER — SENNOSIDES-DOCUSATE SODIUM 8.6-50 MG PO TABS
2.0000 | ORAL_TABLET | ORAL | Status: DC
  Administered 2015-02-19 – 2015-02-21 (×2): 2 via ORAL
  Filled 2015-02-19 (×2): qty 2

## 2015-02-19 MED ORDER — LANOLIN HYDROUS EX OINT: TOPICAL_OINTMENT | CUTANEOUS | Status: DC | PRN

## 2015-02-19 MED ORDER — SIMETHICONE 80 MG PO CHEW: 80.0000 mg | CHEWABLE_TABLET | ORAL | Status: DC | PRN

## 2015-02-19 NOTE — Plan of Care (Signed)
Problem: Consults Goal: Birthing Suites Patient Information Press F2 to bring up selections list  Outcome: Completed/Met Date Met:  02/19/15  Pt 37-[redacted] weeks EGA and Inpatient induction, preeclampsia

## 2015-02-19 NOTE — Anesthesia Preprocedure Evaluation (Signed)
Anesthesia Evaluation  Patient identified by MRN, date of birth, ID band Patient awake    Reviewed: Allergy & Precautions, Patient's Chart, lab work & pertinent test results  History of Anesthesia Complications Negative for: history of anesthetic complications  Airway Mallampati: III  TM Distance: >3 FB Neck ROM: Full    Dental  (+) Teeth Intact   Pulmonary neg pulmonary ROS,  breath sounds clear to auscultation        Cardiovascular hypertension, Pt. on medications Rhythm:Regular     Neuro/Psych negative neurological ROS  negative psych ROS   GI/Hepatic negative GI ROS, Neg liver ROS,   Endo/Other  Morbid obesity  Renal/GU negative Renal ROS     Musculoskeletal   Abdominal   Peds  Hematology  (+) anemia ,   Anesthesia Other Findings   Reproductive/Obstetrics (+) Pregnancy                             Anesthesia Physical Anesthesia Plan  ASA: II  Anesthesia Plan: Epidural   Post-op Pain Management:    Induction:   Airway Management Planned:   Additional Equipment:   Intra-op Plan:   Post-operative Plan:   Informed Consent: I have reviewed the patients History and Physical, chart, labs and discussed the procedure including the risks, benefits and alternatives for the proposed anesthesia with the patient or authorized representative who has indicated his/her understanding and acceptance.   Dental advisory given  Plan Discussed with: Anesthesiologist  Anesthesia Plan Comments:         Anesthesia Quick Evaluation

## 2015-02-19 NOTE — Progress Notes (Signed)
Patient ID: Cassandra Rich, female   DOB: 12/28/1985, 28 y.o.   MRN: [id number] Pt was admitted a little before the anticipated time because she was having painful contractions. Her BP was as high as 185/102 and she was given 20 mg labetalol. She received an epidural and now her BP is normal. Her contractions are slightly less than 2 minutes apart even though she has received no uterotonic agents. She was examined by the RN at 12:10 AM after having SROM at 11;30 pm Her cervix was 2 cm 90% and the vertex was at -2 station.

## 2015-02-19 NOTE — Progress Notes (Signed)
Patient ID: Cassandra Rich, female   DOB: 06/13/1986, 29 y.o.   MRN: [id number] Pt doing well, reached complete dilation and pushing   afeb VSS  FHR overall reassuring   C/c/+1  Continue pushing

## 2015-02-19 NOTE — Lactation Note (Signed)
This note was copied from the chart of Cassandra Rich. Lactation Consultation Note  When I walked into the room mother was in a corner in the dark with her baby attached.  She was wincing, and I could hear that she was in pain.  In the first 6 hours of life her nipples have become traumatized for an incorrect latch.  Baby has been chewing at the breast.  We detached the baby and relatched her but mother was in too much pain to continue the feeding.  She requested formula.  Risks of formula explained to mom.  I talked to her about hand expressing and spoon feeding and she agreed to this.  Mom was taught hand expression with colostrum easily expressible.  Baby ate about 3 ml of expressed breastmilk.  I reviewed how to use the hand pump and changed the flange size.  She will use a #27 on the L and a #30 on the R.  Mom started using comfort gels.  Baby has a frenum under her tongue that causes the tongue to retract with extension.  I suspect this is contributing to mom's pain.  I did not discuss findings with her at this time.  Follow-up tomorrow.  Has information on support groups and outpatient services.  Patient Name: Cassandra Rich Today's Date: 02/19/2015 Reason for consult: Initial assessment   Maternal Data Has patient been taught Hand Expression?: Yes Does the patient have breastfeeding experience prior to this delivery?: No  Feeding Feeding Type: Breast Fed Length of feed: 15 min  LATCH Score/Interventions Latch: Repeated attempts needed to sustain latch, nipple held in mouth throughout feeding, stimulation needed to elicit sucking reflex.  Audible Swallowing: None Intervention(s): Skin to skin;Hand expression  Type of Nipple: Everted at rest and after stimulation  Comfort (Breast/Nipple): Engorged, cracked, bleeding, large blisters, severe discomfort  Problem noted: Cracked, bleeding, blisters, bruises;Severe discomfort Interventions   (Cracked/bleeding/bruising/blister): Expressed breast milk to nipple;Hand pump Interventions (Mild/moderate discomfort): Hand massage;Hand expression;Comfort gels Interventions (Severe discomfort): Flange size  Hold (Positioning): Assistance needed to correctly position infant at breast and maintain latch. Intervention(s): Breastfeeding basics reviewed;Skin to skin  LATCH Score: 4  Lactation Tools Discussed/Used Initiated by:: RN Date initiated:: 02/19/15   Consult Status Consult Status: Follow-up Date: 02/20/15 Follow-up type: In-patient    Soyla DryerJoseph, Afton Lavalle 02/19/2015, 9:42 PM

## 2015-02-19 NOTE — Plan of Care (Signed)
Problem: Consults Goal: Birthing Suites Patient Information Press F2 to bring up selections list   Pt 37-[redacted] weeks EGA and Inpatient induction, preeclampsia

## 2015-02-19 NOTE — Progress Notes (Signed)
Patient ID: Cassandra Rich, female   DOB: 04/21/1986, 29 y.o.   MRN: [id number] Pt has continued to labor with some pitocin augmentation.  Comfortable with epidural BP normal with epidural Cervix 5-6/c/0 IUPC placed to better monitor contractions  FHR overall reassuring, some early mild variables and good scalp stim  Continue to follow progress.

## 2015-02-19 NOTE — Anesthesia Procedure Notes (Signed)
Epidural Patient location during procedure: OB  Staffing Anesthesiologist: Lachlyn Vanderstelt, CHRIS Performed by: anesthesiologist   Preanesthetic Checklist Completed: patient identified, surgical consent, pre-op evaluation, timeout performed, IV checked, risks and benefits discussed and monitors and equipment checked  Epidural Patient position: sitting Prep: site prepped and draped and DuraPrep Patient monitoring: heart rate, cardiac monitor, continuous pulse ox and blood pressure Approach: midline Location: L3-L4 Injection technique: LOR saline  Needle:  Needle type: Tuohy  Needle gauge: 17 G Needle length: 9 cm Needle insertion depth: 8 cm Catheter type: closed end flexible Catheter size: 19 Gauge Catheter at skin depth: 13 cm Test dose: negative and 2% lidocaine with Epi 1:200 K  Assessment Events: blood not aspirated, injection not painful, no injection resistance, negative IV test and no paresthesia  Additional Notes H+P and labs checked, risks and benefits discussed with the patient, consent obtained, procedure tolerated well and without complications.  Reason for block:procedure for pain   

## 2015-02-20 LAB — CBC
HCT: 32.6 % — ABNORMAL LOW (ref 36.0–46.0)
HEMOGLOBIN: 10.7 g/dL — AB (ref 12.0–15.0)
MCH: 28.8 pg (ref 26.0–34.0)
MCHC: 32.8 g/dL (ref 30.0–36.0)
MCV: 87.6 fL (ref 78.0–100.0)
PLATELETS: 240 10*3/uL (ref 150–400)
RBC: 3.72 MIL/uL — ABNORMAL LOW (ref 3.87–5.11)
RDW: 15.3 % (ref 11.5–15.5)
WBC: 22.1 10*3/uL — ABNORMAL HIGH (ref 4.0–10.5)

## 2015-02-20 NOTE — Lactation Note (Addendum)
This note was copied from the chart of Cassandra EritreaVictoria Gowans. Lactation Consultation Note  Patient Name: Cassandra Rich Today's Date: 02/20/2015 Reason for consult: Follow-up assessment;Difficult latch (sore nipples.) Baby 22 hours old. Mom reports that her nipples are very sensitive/sore, but she has been using EBM and comfort gels, and pumping instead of putting baby to breast since yesterday. Assisted mom to latch baby to left breast in football position. Mom wincing before baby even latched at breast. Baby able to latch deeply, started suckling rhythmically with lips flanged, but mom physically withdrawing from baby. Attempted a second time to latch baby, but mom stating she cannot tolerate the pain. Mom's nipples appear intact and no redness or abrasion noted. Enc mom to continue using EBM and colostrum on nipples. Assisted mom to hand express 4 mls of colostrum which were given to baby with spoon. Baby given an additional 6 mls of formula with spoon as well. Enc mom to increase supplemental amounts following guidelines. Handed mom her pump parts to begin pumping and enc mom to use EBM for next feeding. Enc mom to keep attempting to latch baby directly at breast as able, and to offer lots of STS. Discussed that as her milk comes in--comes to volume, baby's mouth may relax and not cause discomfort while baby nursing. Enc mom to pump after each feeding and discussed recommendations for exclusive breast feeding 6 months, and then in addition to supplemental food for at least 2 years. Enc mom to call for assistance as needed.   Maternal Data    Feeding Feeding Type: Breast Fed Length of feed: 2 min  LATCH Score/Interventions Latch: Grasps breast easily, tongue down, lips flanged, rhythmical sucking. Intervention(s): Adjust position;Assist with latch;Breast compression  Audible Swallowing: None Intervention(s): Skin to skin;Hand expression  Type of Nipple: Everted at rest and after  stimulation  Comfort (Breast/Nipple): Engorged, cracked, bleeding, large blisters, severe discomfort  Problem noted: Severe discomfort Interventions  (Cracked/bleeding/bruising/blister): Expressed breast milk to nipple Interventions (Mild/moderate discomfort): Hand massage;Hand expression;Comfort gels;Post-pump Interventions (Severe discomfort): Double electric pum;Observe pumping  Hold (Positioning): Assistance needed to correctly position infant at breast and maintain latch. Intervention(s): Breastfeeding basics reviewed;Support Pillows;Position options;Skin to skin  LATCH Score: 5  Lactation Tools Discussed/Used Tools: Pump;Comfort gels (spoons.) Breast pump type: Double-Electric Breast Pump   Consult Status Consult Status: Follow-up Date: 02/21/15 Follow-up type: In-patient    Cassandra Rich, [name] 02/20/2015, 12:41 PM

## 2015-02-20 NOTE — Progress Notes (Signed)
Post Partum Day 1 Subjective: no complaints and tolerating PO  Objective: Blood pressure 145/92, pulse 101, temperature 97.6 F (36.4 C), temperature source Oral, resp. rate 18, height 5\' 7"  (1.702 m), weight 106.595 kg (235 lb), last menstrual period 05/26/2014, SpO2 98 %, unknown if currently breastfeeding.  Physical Exam:  General: alert and cooperative Lochia: appropriate Uterine Fundus: firm    Recent Labs  02/19/15 1458 02/20/15 0508  HGB 11.4* 10.7*  HCT 34.2* 32.6*    Assessment/Plan: Plan for discharge tomorrow  BP still borderline, not high enough to treat so will follow   LOS: 2 days   Cassandra Rich W 02/20/2015, 1:35 PM

## 2015-02-20 NOTE — Progress Notes (Signed)
PT having severe lower back pain at epidural insertion site 9/10 pain. Anesthesiologist was called and assessed pt. Believes pt has bruising and inflammation around site and will assess her again in the morning. No complaints from the pt at this time.  Loran Fleet W Jailin Manocchio, RN

## 2015-02-20 NOTE — Progress Notes (Signed)
Pt requested mirilax

## 2015-02-20 NOTE — Anesthesia Postprocedure Evaluation (Signed)
  Anesthesia Post-op Note  Patient: Cassandra Rich  Procedure(s) Performed: * No procedures listed *  Patient Location: PACU and Mother/Baby  Anesthesia Type:Epidural  Level of Consciousness: awake, alert  and oriented  Airway and Oxygen Therapy: Patient Spontanous Breathing  Post-op Pain: none  Post-op Assessment: Post-op Vital signs reviewed, Patient's Cardiovascular Status Stable, No headache, No backache, No residual numbness and No residual motor weakness  Post-op Vital Signs: Reviewed and stable  Complications: No apparent anesthesia complications

## 2015-02-21 MED ORDER — IBUPROFEN 600 MG PO TABS: 600.0000 mg | ORAL_TABLET | Freq: Four times a day (QID) | ORAL | Status: AC

## 2015-02-21 MED ORDER — POLYETHYLENE GLYCOL 3350 17 G PO PACK
17.0000 g | PACK | Freq: Once | ORAL | Status: DC
  Filled 2015-02-21: qty 1

## 2015-02-21 NOTE — Progress Notes (Signed)
PPD #2 C/o constipation-wants Miralax, pain and numbness in hands, swelling in feet Afeb, VSS Fundus firm Will Rx Miralax prior to d/c, wrist splints for temporary CTS

## 2015-02-21 NOTE — Progress Notes (Signed)
CLINICAL SOCIAL WORK MATERNAL/CHILD NOTE  Patient Details  Name: Cassandra Rich MRN: [id number] Date of Birth: 06/05/2014  Date:  02/21/2015  Clinical Social Worker Initiating Note:  Loleta BooksSarah Marcellene Shivley, LCSW Date/ Time Initiated:  02/21/15/0950     Child's Name:  Cassandra Rich   Legal Guardian:  Mother: [facility] Rich   Need for Interpreter:  None   Date of Referral:  02/21/15     Reason for Referral:  History of bipolar  [facility]   Address:  [address] [place], [place] [address]  Phone number:  [phone]   Household Members:  Self   Natural Supports (not living in the home):  Extended Family, Friends, Immediate Family   Professional Supports: None   Employment: Full-time   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 She stated that he is now currently in jail.  [name] discussed that she is no longer in a relationship with him, and is continuing to "move on" instead of dwelling on the past. 2)Mental Health Concerns : [name] presents with history of bipolar. Per [name], received diagnosis as a teenager, but denied any symptoms since high school. [name] reported relational stress with FOB contributed to feelings of sadness early in the pregnancy, but denied presence of additional symptoms.   Cognitive State:  Able to Concentrate , Alert , Goal Oriented , Linear Thinking    Mood/Affect:  Bright , Happy , Interested , Calm , Comfortable    CSW Assessment:  CSW received request for consult due to [name] presenting with a history of bipolar.  CSW spoke with  RN who denied presence of any symptoms while on Mother Baby Unit.  [name] presented as easily engaged and receptive to the visit. She was noted to be in a pleasant mood, and displayed a full range in affect.  [name] attended to the infant during the visit, and was noted to smile brightly when interacting with her. [name] did not present with any acute mental health symptoms and appeared comfortable interacting and caring for the infant.   [name] endorsed readiness and eagerness for discharge. She stated that she is looking forward to returning home and adjusting to her new "normal".  [name] shared that it continues to feel surreal that the infant has finally been born, but she discussed feelings of happiness and excited secondary to role transition to motherhood.  [name] discussed having the home prepared for the infant and a strong support system. [name] shared that she [place] have any family in [place], but they all have plans to travel in from [place] to provide her with support.  [name] discussed how she will continue to have support once her family leaves since she has numerous friends who are actively involved in her life.  [name] denied questions, concerns, or needs as she transitions to the postpartum period.   Per [name], mental health history significant for bipolar. She reported that she received diagnosis as a teenager and was prescribed medications, but she shared that she did not remain on medications for long due to how they made  her feel. [name] stated that during high school she had intense mood swings and became easily agitated and frustration.  [name] denied any concerns regarding these symptoms since high school since she has learned how to self-regulate and increase her tolerance levels. [name] presented as receptive to exploring how these skills can be transferred to parenting. [name] reported feeling of sadness early in the pregnancy due to stress with the FOB. She stated that she learned that he had been unfaithful and then  he is ended up in jail.  [name] shared that she is no longer in a relationship with him, and only remains in contact with him to inform him of updates related to the infant.  [name] denied that this relationship is an ongoing stressor, and discussed her efforts to be accept the past instead of dwelling on it.  As [name] transitions to postpartum, she denied any notable anxiety or depression.  [name] shared that she does feel a need to watch the infant when she is sleeping out of fear that something bad will happen if she is not watching, but she discussed how her mother has already talked with her about the importance of getting sleep.    [name] presented as attentive and engaged as CSW provided education on perinatal mood and anxiety disorders. She denied questions or concerns related to the information, and acknowledged increased risk due to mental health history. [name] agreed to contact her medical provider if she notes onset of symptoms.   CSW Plan/Description:   1)Patient/Family Education: Perinatal mood and anxiety disorders. [name] to contact her OB if she notes onset of symptoms.  2)No Further Intervention Required/No Barriers to Discharge    Sears Oran N, LCSW 02/21/2015, 10:26 AM  

## 2015-02-21 NOTE — Lactation Note (Signed)
This note was copied from the chart of Cassandra Rich. Lactation Consultation Note; Mom has been bottle feeding EBM and formula because her nipples are so sore. Reports her breasts are feeling a little heavier this morning. Reports she pumped 3 times yesterday and is obtained 5-7 cc's. Encouraged to pump every time baby feeds to promote a good milk supply and prevent engorgement. Has manual pump for home. Suggested trying to latch baby again as nipples heal. No questions at present. To call prn  Patient Name: Cassandra Genevie AnnVictoria Gorman Today's Date: 02/21/2015 Reason for consult: Follow-up assessment   Maternal Data Formula Feeding for Exclusion: Yes Reason for exclusion: Mother's choice to formula and breast feed on admission Has patient been taught Hand Expression?: Yes Does the patient have breastfeeding experience prior to this delivery?: No  Feeding Feeding Type: Formula Nipple Type: Slow - flow  LATCH Score/Interventions                      Lactation Tools Discussed/Used     Consult Status Consult Status: Complete    Pamelia HoitWeeks, Muslima Toppins D 02/21/2015, 9:37 AM

## 2015-02-21 NOTE — Discharge Summary (Signed)
Obstetric Discharge Summary Reason for Admission: onset of labor Prenatal Procedures: NST and Preeclampsia Intrapartum Procedures: spontaneous vaginal delivery Postpartum Procedures: none Complications-Operative and Postpartum: 1st degree perineal laceration HEMOGLOBIN  Date Value Ref Range Status  02/20/2015 10.7* 12.0 - 15.0 g/dL Final   HCT  Date Value Ref Range Status  02/20/2015 32.6* 36.0 - 46.0 % Final    Physical Exam:  General: alert Lochia: appropriate Uterine Fundus: firm   Discharge Diagnoses: Term Pregnancy-delivered and Preelampsia  Discharge Information: Date: 02/21/2015 Activity: pelvic rest Diet: routine Medications: Ibuprofen Condition: stable Instructions: refer to practice specific booklet Discharge to: home Follow-up Information    Follow up with Oliver PilaICHARDSON,KATHY W, MD. Schedule an appointment as soon as possible for a visit in 6 weeks.   Specialty:  Obstetrics and Gynecology   Contact information:   [address] [place] [address] [phone]       Newborn Data: Live born female  Birth Weight: 6 lb 15.6 oz (3164 g) APGAR: 8, 9  Home with mother.  Cassandra Rich D 02/21/2015, 8:59 AM

## 2015-02-21 NOTE — Discharge Instructions (Signed)
As per discharge pamphlet °

## 2015-02-25 ENCOUNTER — Inpatient Hospital Stay (HOSPITAL_COMMUNITY)
Admission: AD | Admit: 2015-02-25 | Discharge: 2015-02-27 | DRG: 776 | Disposition: A | Discharge status: Home / Self Care | Payer: Medicaid Other | Source: Ambulatory Visit | Attending: Obstetrics and Gynecology | Admitting: Obstetrics and Gynecology

## 2015-02-25 ENCOUNTER — Encounter (HOSPITAL_COMMUNITY): Payer: Self-pay | Admitting: *Deleted

## 2015-02-25 DIAGNOSIS — I158 Other secondary hypertension: Secondary | ICD-10-CM | POA: Diagnosis present

## 2015-02-25 DIAGNOSIS — O9089 Other complications of the puerperium, not elsewhere classified: Principal | ICD-10-CM | POA: Diagnosis present

## 2015-02-25 DIAGNOSIS — R030 Elevated blood-pressure reading, without diagnosis of hypertension: Secondary | ICD-10-CM | POA: Diagnosis present

## 2015-02-25 DIAGNOSIS — O1495 Unspecified pre-eclampsia, complicating the puerperium: Secondary | ICD-10-CM | POA: Diagnosis present

## 2015-02-25 HISTORY — DX: Gestational (pregnancy-induced) hypertension without significant proteinuria, unspecified trimester: O13.9

## 2015-02-25 LAB — URINE MICROSCOPIC-ADD ON

## 2015-02-25 LAB — COMPREHENSIVE METABOLIC PANEL
ALK PHOS: 72 U/L (ref 38–126)
ALT: 35 U/L (ref 14–54)
AST: 23 U/L (ref 15–41)
Albumin: 2.9 g/dL — ABNORMAL LOW (ref 3.5–5.0)
Anion gap: 6 (ref 5–15)
BUN: 13 mg/dL (ref 6–20)
CO2: 24 mmol/L (ref 22–32)
CREATININE: 0.79 mg/dL (ref 0.44–1.00)
Calcium: 8.7 mg/dL — ABNORMAL LOW (ref 8.9–10.3)
Chloride: 108 mmol/L (ref 101–111)
GFR calc non Af Amer: >60 mL/min (ref >60)
Glucose, Bld: 100 mg/dL — ABNORMAL HIGH (ref 65–99)
Potassium: 3.8 mmol/L (ref 3.5–5.1)
SODIUM: 138 mmol/L (ref 135–145)
Total Bilirubin: 0.4 mg/dL (ref 0.3–1.2)
Total Protein: 6.7 g/dL (ref 6.5–8.1)

## 2015-02-25 LAB — CBC
HCT: 31.1 % — ABNORMAL LOW (ref 36.0–46.0)
HEMOGLOBIN: 10.1 g/dL — AB (ref 12.0–15.0)
MCH: 28.6 pg (ref 26.0–34.0)
MCHC: 32.5 g/dL (ref 30.0–36.0)
MCV: 88.1 fL (ref 78.0–100.0)
PLATELETS: 326 10*3/uL (ref 150–400)
RBC: 3.53 MIL/uL — AB (ref 3.87–5.11)
RDW: 15.7 % — AB (ref 11.5–15.5)
WBC: 9.5 10*3/uL (ref 4.0–10.5)

## 2015-02-25 LAB — PROTEIN / CREATININE RATIO, URINE
Creatinine, Urine: 331 mg/dL
Protein Creatinine Ratio: 0.16 mg/mg{Cre} — ABNORMAL HIGH (ref 0.00–0.15)
TOTAL PROTEIN, URINE: 54 mg/dL

## 2015-02-25 LAB — URINALYSIS, ROUTINE W REFLEX MICROSCOPIC
Bilirubin Urine: NEGATIVE
Glucose, UA: NEGATIVE mg/dL
KETONES UR: NEGATIVE mg/dL
NITRITE: NEGATIVE
Protein, ur: 30 mg/dL — AB
Specific Gravity, Urine: >1.03 — ABNORMAL HIGH (ref 1.005–1.030)
UROBILINOGEN UA: 1 mg/dL (ref 0.0–1.0)
pH: 6 (ref 5.0–8.0)

## 2015-02-25 MED ORDER — OXYCODONE-ACETAMINOPHEN 5-325 MG PO TABS: 1.0000 | ORAL_TABLET | Freq: Four times a day (QID) | ORAL | Status: DC | PRN

## 2015-02-25 MED ORDER — LABETALOL HCL 5 MG/ML IV SOLN
20.0000 mg | Freq: Once | INTRAVENOUS | Status: AC
  Administered 2015-02-25: 20 mg via INTRAVENOUS

## 2015-02-25 MED ORDER — LABETALOL HCL 5 MG/ML IV SOLN
10.0000 mg | Freq: Once | INTRAVENOUS | Status: AC
  Administered 2015-02-25: 10 mg via INTRAVENOUS
  Filled 2015-02-25: qty 4

## 2015-02-25 MED ORDER — OXYCODONE-ACETAMINOPHEN 5-325 MG PO TABS
1.0000 | ORAL_TABLET | Freq: Once | ORAL | Status: AC
  Administered 2015-02-25: 1 via ORAL
  Filled 2015-02-25: qty 1

## 2015-02-25 MED ORDER — HYDRALAZINE HCL 20 MG/ML IJ SOLN: 10.0000 mg | Freq: Once | INTRAMUSCULAR | Status: AC | PRN

## 2015-02-25 MED ORDER — MAGNESIUM SULFATE BOLUS VIA INFUSION
4.0000 g | Freq: Once | INTRAVENOUS | Status: DC
  Filled 2015-02-25: qty 500

## 2015-02-25 MED ORDER — LABETALOL HCL 5 MG/ML IV SOLN
20.0000 mg | INTRAVENOUS | Status: DC | PRN
  Administered 2015-02-26: 40 mg via INTRAVENOUS
  Administered 2015-02-26: 20 mg via INTRAVENOUS
  Filled 2015-02-25 (×2): qty 8
  Filled 2015-02-25: qty 4

## 2015-02-25 MED ORDER — LABETALOL HCL 5 MG/ML IV SOLN
INTRAVENOUS | Status: AC
  Filled 2015-02-25: qty 4

## 2015-02-25 MED ORDER — HYDRALAZINE HCL 20 MG/ML IJ SOLN
10.0000 mg | Freq: Once | INTRAMUSCULAR | Status: AC
  Administered 2015-02-25: 10 mg via INTRAVENOUS
  Filled 2015-02-25: qty 1

## 2015-02-25 MED ORDER — OXYCODONE-ACETAMINOPHEN 5-325 MG PO TABS
1.0000 | ORAL_TABLET | Freq: Once | ORAL | Status: AC
  Administered 2015-02-26: 1 via ORAL
  Filled 2015-02-25: qty 1

## 2015-02-25 MED ORDER — MAGNESIUM SULFATE 50 % IJ SOLN
2.0000 g/h | INTRAMUSCULAR | Status: AC
  Administered 2015-02-25 – 2015-02-26 (×2): 2 g/h via INTRAVENOUS
  Filled 2015-02-25 (×2): qty 80

## 2015-02-25 MED ORDER — ACETAMINOPHEN 500 MG PO TABS
1000.0000 mg | ORAL_TABLET | Freq: Once | ORAL | Status: AC
  Administered 2015-02-25: 1000 mg via ORAL
  Filled 2015-02-25: qty 2

## 2015-02-25 NOTE — MAU Provider Note (Signed)
History     CSN: [id number]  Arrival date and time: 02/25/15 1528   First Provider Initiated Contact with Patient 02/25/15 1706      Chief Complaint  Patient presents with  . Hypertension   HPI Comments: Cassandra Rich is a 29 y.o. G1P1001 at 6 days postpartum presenting with severe range blood pressure (per [facility] nurse today) and moderately severe left frontal headache. Pain radiates to face , She had IOL for preeclampsia at term with BPs at discharge 140/80s-90. She was seen in the office yesterday for pedal edema and BP recheck. She was started on labetalol 100 mg twice a day yesterday; last dose was @0700 .  Breastfeeding. Lochia tapering but reddish.    Hypertension Associated symptoms include headaches. Pertinent negatives include no blurred vision or neck pain.  Headache  This is a new problem. The current episode started today. The problem occurs constantly. The problem has been gradually worsening. The pain is located in the bilateral and frontal region. The pain does not radiate. The pain quality is not similar to prior headaches (Reports hx migraines). The quality of the pain is described as throbbing. The pain is at a severity of 8/10. The pain is moderate. Pertinent negatives include no abdominal pain, back pain, blurred vision, dizziness or neck pain. Associated symptoms comments: Reports seeing 2 spots in visual field briefly today.. Nothing aggravates the symptoms. She has tried NSAIDs (Last dose 12 noon) for the symptoms. The treatment provided mild relief. Her past medical history is significant for hypertension and migraine headaches. There is no history of recent head traumas or sinus disease.    OB History  Gravida Para Term Preterm AB SAB TAB Ectopic Multiple Living  1 1 1       0 1    # Outcome Date GA Lbr Len/2nd Weight Sex Delivery Anes PTL Lv  1 Term 02/19/15 [redacted]w[redacted]d 08:30 / 01:12 3.164 kg (6 lb 15.6 oz) F Vag-Spont EPI  Y      Past Medical History  Diagnosis  Date  . Hypertension   . Hx of chlamydia infection     Past Surgical History  Procedure Laterality Date  . No past surgeries      Family History  Problem Relation Age of Onset  . Diabetes Father   . Hypertension Mother     History  Substance Use Topics  . Smoking status: Never Smoker   . Smokeless tobacco: Never Used  . Alcohol Use: Yes     Comment: occas. prior to preg.    Allergies: No Known Allergies  Prescriptions prior to admission  Medication Sig Dispense Refill Last Dose  . ibuprofen (ADVIL,MOTRIN) 600 MG tablet Take 1 tablet (600 mg total) by mouth every 6 (six) hours. (Patient taking differently: Take 600 mg by mouth every 6 (six) hours. For pain) 30 tablet 0 02/24/2015 at Unknown time  . labetalol (NORMODYNE) 100 MG tablet Take 100 mg by mouth 2 (two) times daily.   02/25/2015 at 0700  . Prenatal Vit-Min-FA-Fish Oil ([facility] PRENATAL GUMMY PO) Take 2 tablets by mouth daily.   02/25/2015 at Unknown time    Review of Systems  Eyes: Negative for blurred vision.  Gastrointestinal: Negative for abdominal pain.  Musculoskeletal: Negative for back pain and neck pain.  Neurological: Positive for headaches. Negative for dizziness.   Physical Exam   Blood pressure 169/101, pulse 88, temperature 98.7 F (37.1 C), temperature source Oral, resp. rate 18, height 5\' 7"  (1.702 m), weight 105.745 kg (233  lb 2 oz), last menstrual period 05/26/2014, currently breastfeeding. Serial 172/97; 169/93; 169/101 Filed Vitals:   02/25/15 1920 02/25/15 1930 02/25/15 1953 02/25/15 2001  BP: 171/103 170/102 168/95 167/98  Pulse: 84 84 79   Temp:      TempSrc:      Resp:      Height:      Weight:       Physical Exam  Nursing note and vitals reviewed. Constitutional: She is oriented to person, place, and time. She appears well-developed and well-nourished.  HENT:  Head: Normocephalic.  Eyes: Conjunctivae are normal. Pupils are equal, round, and reactive to light.  Neck: Normal range of  motion.  Mild left neck tenderness on turning neck to right  Cardiovascular: Normal rate.   Respiratory: Effort normal.  GI: Soft. There is tenderness.  Mildly subinvoluted with some fundal tenderness  Musculoskeletal: She exhibits edema. She exhibits no tenderness.  2+ pedal, 1+ pretibial edema  Neurological: She is alert and oriented to person, place, and time. She has normal reflexes. Coordination normal.  Skin: Skin is warm and dry.  Psychiatric: She has a normal mood and affect.    MAU Course  Procedures  Results for orders placed or performed during the hospital encounter of 02/25/15 (from the past 24 hour(s))  Urinalysis, Routine w reflex microscopic ([facility])     Status: Abnormal   Collection Time: 02/25/15  3:57 PM  Result Value Ref Range   Color, Urine YELLOW YELLOW   APPearance HAZY (A) CLEAR   Specific Gravity, Urine >1.030 (H) 1.005 - 1.030   pH 6.0 5.0 - 8.0   Glucose, UA NEGATIVE NEGATIVE mg/dL   Hgb urine dipstick LARGE (A) NEGATIVE   Bilirubin Urine NEGATIVE NEGATIVE   Ketones, ur NEGATIVE NEGATIVE mg/dL   Protein, ur 30 (A) NEGATIVE mg/dL   Urobilinogen, UA 1.0 0.0 - 1.0 mg/dL   Nitrite NEGATIVE NEGATIVE   Leukocytes, UA SMALL (A) NEGATIVE  Protein / creatinine ratio, urine     Status: Abnormal   Collection Time: 02/25/15  3:57 PM  Result Value Ref Range   Creatinine, Urine 331.00 mg/dL   Total Protein, Urine 54 mg/dL   Protein Creatinine Ratio 0.16 (H) 0.00 - 0.15 mg/mg[Cre]  Urine microscopic-add on     Status: Abnormal   Collection Time: 02/25/15  3:57 PM  Result Value Ref Range   Squamous Epithelial / LPF FEW (A) RARE   WBC, UA 21-50 <3 WBC/hpf   RBC / HPF 0-2 <3 RBC/hpf   Bacteria, UA MANY (A) RARE   Urine-Other MUCOUS PRESENT   CBC     Status: Abnormal   Collection Time: 02/25/15  4:20 PM  Result Value Ref Range   WBC 9.5 4.0 - 10.5 K/uL   RBC 3.53 (L) 3.87 - 5.11 MIL/uL   Hemoglobin 10.1 (L) 12.0 - 15.0 g/dL   HCT 82.931.1 (L) 56.236.0 - 13.046.0 %    MCV 88.1 78.0 - 100.0 fL   MCH 28.6 26.0 - 34.0 pg   MCHC 32.5 30.0 - 36.0 g/dL   RDW 86.515.7 (H) 78.411.5 - 69.615.5 %   Platelets 326 150 - 400 K/uL  Comprehensive metabolic panel     Status: Abnormal   Collection Time: 02/25/15  4:20 PM  Result Value Ref Range   Sodium 138 135 - 145 mmol/L   Potassium 3.8 3.5 - 5.1 mmol/L   Chloride 108 101 - 111 mmol/L   CO2 24 22 - 32 mmol/L  Glucose, Bld 100 (H) 65 - 99 mg/dL   BUN 13 6 - 20 mg/dL   Creatinine, Ser 9.60 0.44 - 1.00 mg/dL   Calcium 8.7 (L) 8.9 - 10.3 mg/dL   Total Protein 6.7 6.5 - 8.1 g/dL   Albumin 2.9 (L) 3.5 - 5.0 g/dL   AST 23 15 - 41 U/L   ALT 35 14 - 54 U/L   Alkaline Phosphatase 72 38 - 126 U/L   Total Bilirubin 0.4 0.3 - 1.2 mg/dL   GFR calc non Af Amer >60 >60 mL/min   GFR calc Af Amer >60 >60 mL/min   Anion gap 6 5 - 15   Percocet 5/325mg  given at 1815 C/W Dr. Mindi Slicker: Labetalol  IV at 1830 C/W Dr. Mindi Slicker re: unimproved BPs> will give Labetalol  IV and plan to admit  1935: C/W Dr. Mindi Slicker re: unimproved BPs and H/A worse> Hydralazine 10 mg IV 2014: C?W Dr. Mindi Slicker will admit   Assessment and Plan  G1 at 6d postpartum with severe features of preeclampsia > admit  POE,DEIRDRE 02/25/2015, 5:07 PM

## 2015-02-25 NOTE — MAU Note (Signed)
Patient presents post SVD on 02/19/15 with c/o high blood pressure since delivery. Denies discharge but continues post-delivery bleeding.

## 2015-02-25 NOTE — MAU Note (Signed)
Pt was started on Labetalol 100 mg bid yesterday.  Pt had a dose last night and at 0700 this am.

## 2015-02-25 NOTE — H&P (Addendum)
  Pt is a G1P1001 female s/p delivery due to preecclampsia who presented to the MAU due to elevated BP and persistent headache. Pt states has had intermittent headaches since discharge home. She was noted to have BPs of [phone] on arrival.  Pt was given 10mg  then 20mg  of labetalol 45mins apart and then 10mg  of hydralazine with no resolution of BPs. Pt also reported her headache worsened from a 7/10 to a 9/10 despite percocet and tylenol.  Decision made to admit pt for seizure prophylaxis with magnesium sulfate, control of BP with labetalol protocol and management of headache.  Plan of care explained to pt and family member present. All questions answered

## 2015-02-26 DIAGNOSIS — O1495 Unspecified pre-eclampsia, complicating the puerperium: Secondary | ICD-10-CM | POA: Diagnosis present

## 2015-02-26 LAB — COMPREHENSIVE METABOLIC PANEL
ALBUMIN: 2.6 g/dL — AB (ref 3.5–5.0)
ALT: 31 U/L (ref 14–54)
ANION GAP: 8 (ref 5–15)
AST: 20 U/L (ref 15–41)
Alkaline Phosphatase: 66 U/L (ref 38–126)
BUN: 8 mg/dL (ref 6–20)
CO2: 25 mmol/L (ref 22–32)
Calcium: 8.1 mg/dL — ABNORMAL LOW (ref 8.9–10.3)
Chloride: 107 mmol/L (ref 101–111)
Creatinine, Ser: 0.6 mg/dL (ref 0.44–1.00)
GFR calc non Af Amer: >60 mL/min (ref >60)
Glucose, Bld: 97 mg/dL (ref 65–99)
POTASSIUM: 3.6 mmol/L (ref 3.5–5.1)
Sodium: 140 mmol/L (ref 135–145)
TOTAL PROTEIN: 6.3 g/dL — AB (ref 6.5–8.1)
Total Bilirubin: 0.6 mg/dL (ref 0.3–1.2)

## 2015-02-26 LAB — CBC
HCT: 32.4 % — ABNORMAL LOW (ref 36.0–46.0)
HEMOGLOBIN: 10.6 g/dL — AB (ref 12.0–15.0)
MCH: 28.8 pg (ref 26.0–34.0)
MCHC: 32.7 g/dL (ref 30.0–36.0)
MCV: 88 fL (ref 78.0–100.0)
Platelets: 349 10*3/uL (ref 150–400)
RBC: 3.68 MIL/uL — AB (ref 3.87–5.11)
RDW: 15.5 % (ref 11.5–15.5)
WBC: 9 10*3/uL (ref 4.0–10.5)

## 2015-02-26 LAB — PROTIME-INR
INR: 1.04 (ref 0.00–1.49)
PROTHROMBIN TIME: 13.8 s (ref 11.6–15.2)

## 2015-02-26 LAB — APTT: aPTT: 26 seconds (ref 24–37)

## 2015-02-26 LAB — MAGNESIUM: Magnesium: 4.2 mg/dL — ABNORMAL HIGH (ref 1.7–2.4)

## 2015-02-26 MED ORDER — LABETALOL HCL 200 MG PO TABS
200.0000 mg | ORAL_TABLET | Freq: Two times a day (BID) | ORAL | Status: DC
  Administered 2015-02-26 – 2015-02-27 (×3): 200 mg via ORAL
  Filled 2015-02-26 (×5): qty 1

## 2015-02-26 MED ORDER — OXYCODONE-ACETAMINOPHEN 5-325 MG PO TABS
2.0000 | ORAL_TABLET | ORAL | Status: DC | PRN
  Administered 2015-02-26: 2 via ORAL

## 2015-02-26 MED ORDER — CYCLOBENZAPRINE HCL 10 MG PO TABS
10.0000 mg | ORAL_TABLET | Freq: Three times a day (TID) | ORAL | Status: DC | PRN
  Administered 2015-02-26 (×2): 10 mg via ORAL
  Filled 2015-02-26 (×4): qty 1

## 2015-02-26 MED ORDER — OXYCODONE-ACETAMINOPHEN 5-325 MG PO TABS
2.0000 | ORAL_TABLET | Freq: Four times a day (QID) | ORAL | Status: DC | PRN
  Administered 2015-02-26 (×2): 2 via ORAL
  Filled 2015-02-26 (×3): qty 2

## 2015-02-26 MED ORDER — BUTALBITAL-APAP-CAFFEINE 50-325-40 MG PO TABS
2.0000 | ORAL_TABLET | Freq: Once | ORAL | Status: AC
  Administered 2015-02-26: 2 via ORAL
  Filled 2015-02-26: qty 2

## 2015-02-26 MED ORDER — LACTATED RINGERS IV SOLN
INTRAVENOUS | Status: DC
  Administered 2015-02-26 (×3): via INTRAVENOUS

## 2015-02-26 NOTE — Progress Notes (Signed)
UR chart review completed.  

## 2015-02-26 NOTE — Progress Notes (Signed)
Pt c/o headache/neck pain 10/10. Dr Mindi SlickerBanga notified, order received for fiorcet 2 tabs x1 dose.

## 2015-02-26 NOTE — Progress Notes (Signed)
Called to see patient about pain at epidural site.  Patient had epidural 1 week ago for vaginal delivery.  Pushed for about 90 minutes.  She has been re- admitted for treatment of post partum PIH.  She denies bowl or bladder problems, muscle weakness, fever, loss of sensation, or drainage from epidural site.  Exam shows no redness, no warmth, no palpable masses, and no drainage at epidural site.  Suspect either deep bruising from epidural needle  and or muscle pain from stretching during the process of delivery.  Would expect symptoms to completely resolve.  Would treat with pain meds and or local heat application.

## 2015-02-26 NOTE — Progress Notes (Signed)
Spoke with Dr Mindi Slickerbanga re: patient's current BP:   02/26/15 0600  Vital Signs  BP (!) 166/96 mmHg  Pulse Rate 88  Oxygen Therapy  SpO2 99 %   Initiating labetolol protocol starting with 20mg  IV.

## 2015-02-26 NOTE — Progress Notes (Signed)
Upon making routine rounds to AICU Chaplain was referred to see mother who returned to hospital after delivery date of one week, she reports she was readmitted for elevated bloop pressure.  Chaplain dialogued with mother who shared her emotions of having to return back to the to hospital after having just being recently discharged. Chaplain offered a listening presence and encouragement to explained the importance of her well being and that of her newborn.  She was in agreement and appreciated the visit, Chaplain will continue to follow-up for spiritual and emotional support. Chaplain Janell QuietAudrey Thornton

## 2015-02-26 NOTE — Progress Notes (Signed)
Post Partum Day 7 Subjective: voiding  Pts/e today. Feels better this am. States headache still present in posterior head and back of neck but intensity has decreased. No visual disturbances. Has been voiding copiously overnight. Denis any RUQ pain. Lochia mild-moderate. Baby at bedside, bonding well.   Objective: Blood pressure 148/95, pulse 81, temperature 98.8 F (37.1 C), temperature source Oral, resp. rate 18, height 5\' 7"  (1.702 m), weight 109.317 kg (241 lb), last menstrual period 05/26/2014, SpO2 100 %, currently breastfeeding.  Physical Exam:  General: alert and cooperative Lochia: appropriate Uterine Fundus: firm DVT Evaluation: No evidence of DVT seen on physical exam.   Recent Labs  02/25/15 1620 02/26/15 0511  HGB 10.1* 10.6*  HCT 31.1* 32.4*    Assessment/Plan: Continue on magnesium sulfate till 24hrs are up Start on po labetalol 200mg  bid Continue iv labetalol per protocol May have regular diet Continue to monitor i/o    Sharol GivenCecilia Worema Kelsea Mousel 02/26/2015, 10:17 AM

## 2015-02-26 NOTE — Consult Note (Signed)
Lactation note: [facility].  She delivered one week ago.  She states baby was latching to breast but nipples became sore so she started pumping.  She reports pumping 9 ounces this AM.        Instructed to pump breasts every 3 hours to maintain good milk supply.  She is not feeling well today so encouraged to rest and we can work on latch with her tomorrow if she desires.  Mom agreeable.  Encouraged to call with concerns or assist prn.

## 2015-02-26 NOTE — Progress Notes (Signed)
Spoke with Dr Mindi SlickerBanga via telephone d/t patient c/o headache 9/10. Order received for percocet 5-325mg , one tab po x1 dose.

## 2015-02-27 LAB — COMPREHENSIVE METABOLIC PANEL
ALT: 23 U/L (ref 14–54)
AST: 16 U/L (ref 15–41)
Albumin: 2.6 g/dL — ABNORMAL LOW (ref 3.5–5.0)
Alkaline Phosphatase: 60 U/L (ref 38–126)
Anion gap: 4 — ABNORMAL LOW (ref 5–15)
BILIRUBIN TOTAL: 0.2 mg/dL — AB (ref 0.3–1.2)
BUN: 9 mg/dL (ref 6–20)
CALCIUM: 7.7 mg/dL — AB (ref 8.9–10.3)
CHLORIDE: 106 mmol/L (ref 101–111)
CO2: 26 mmol/L (ref 22–32)
Creatinine, Ser: 0.83 mg/dL (ref 0.44–1.00)
GFR calc non Af Amer: >60 mL/min (ref >60)
Glucose, Bld: 97 mg/dL (ref 65–99)
Potassium: 4.2 mmol/L (ref 3.5–5.1)
Sodium: 136 mmol/L (ref 135–145)
Total Protein: 5.8 g/dL — ABNORMAL LOW (ref 6.5–8.1)

## 2015-02-27 LAB — CBC
HCT: 32.1 % — ABNORMAL LOW (ref 36.0–46.0)
HEMOGLOBIN: 10.3 g/dL — AB (ref 12.0–15.0)
MCH: 28.3 pg (ref 26.0–34.0)
MCHC: 32.1 g/dL (ref 30.0–36.0)
MCV: 88.2 fL (ref 78.0–100.0)
Platelets: 345 10*3/uL (ref 150–400)
RBC: 3.64 MIL/uL — ABNORMAL LOW (ref 3.87–5.11)
RDW: 15.6 % — AB (ref 11.5–15.5)
WBC: 7 10*3/uL (ref 4.0–10.5)

## 2015-02-27 LAB — URIC ACID: Uric Acid, Serum: 7.2 mg/dL — ABNORMAL HIGH (ref 2.3–6.6)

## 2015-02-27 MED ORDER — IBUPROFEN 600 MG PO TABS
600.0000 mg | ORAL_TABLET | Freq: Once | ORAL | Status: AC
  Administered 2015-02-27: 600 mg via ORAL
  Filled 2015-02-27: qty 1

## 2015-02-27 MED ORDER — OXYCODONE-ACETAMINOPHEN 5-325 MG PO TABS: 2.0000 | ORAL_TABLET | ORAL | Status: AC | PRN

## 2015-02-27 MED ORDER — LABETALOL HCL 100 MG PO TABS: 200.0000 mg | ORAL_TABLET | Freq: Two times a day (BID) | ORAL | Status: AC

## 2015-02-27 MED ORDER — MAGNESIUM SULFATE 50 % IJ SOLN: 2.0000 g/h | INTRAVENOUS | Status: DC

## 2015-02-27 NOTE — Progress Notes (Signed)
Patient c/o lower back pain 6/10.  Patient requesting Motrin, instead of the ordered Percocet.  Dr. Senaida Oresichardson notified and update on BP.  Dr. Senaida Oresichardson gave an order for a one time dose of Motrin 600 mg prior to discharge.  Patient to be discharged after she finishes eating lunch.

## 2015-02-27 NOTE — Progress Notes (Signed)
Patient ID: Cassandra Rich, female   DOB: 09/14/1985, 29 y.o.   MRN: [id number] HD 3 postpartum preeclampsia  Pt reports HA has improved, just dull now.  No pain meds in awhile.  Backache also improved and we discussed may have been exacerbated by lying in the hospital bed  BP now normal on labetalol 200mg po BID, 140/80 max Pt has diuresed about 3 liters since admission Labs WNL  LE with minimal edema  Pt now off magnesium for about 3 hours and BP remain stable. Will d/c home after off magnesium 5-6 hours if BP stay stable Encouraged to get up and move around to help back pain Will go home on Labetalol 200mg  po BID Given percocet for HA/backache prn, knows to call if gets severe again

## 2015-02-27 NOTE — Discharge Summary (Signed)
Physician Discharge Summary  Patient ID: Cassandra Rich MRN: [id number] DOB/AGE: 28/04/1986 28 y.o.  Admit date: 02/25/2015 Discharge date: 02/27/2015  Admission Diagnoses:  Postpartum preeclampsia  Discharge Diagnoses:  Active Problems:   Preeclampsia in postpartum period   Discharged Condition: good  Hospital Course: Pt was seen in office 02/24/15 with c/o lower extremity edema.  BP then was 150/90-100 and PIH labs WNL. She denied any HA or PIH sx at that visit. The next day the [facility] nurse reported her BP was up to 180/105 and she had a headache (although she had failed to call and report this to us as instructed).  She was advised to come to the hospital.  On admission labs were WNL and pt required IV Labetalol to control her BP.  She reported a 9/10 HA that did not respond to po medications and so was started on magnesium sulfate for seizure porphylaxis.  She continued to c/o a headache and low back pain for about 24 hours.  Anesthesia evaluated her epidural site and found no issues.  The headache eventually improved as did the back pain.  Her BP normalized on labetalol 200mg  po BID.  She was d/c home on that regimen to f/u for BP check in one week.       Treatments: Magnesium  Discharge Exam: Blood pressure 136/76, pulse 81, temperature 98 F (36.7 C), temperature source Oral, resp. rate 18, height 5\' 7"  (1.702 m), weight 104.339 kg (230 lb 0.4 oz), last menstrual period 05/26/2014, SpO2 99 %, currently breastfeeding. General appearance: alert and cooperative GI: soft NT  Disposition: 01-Home or Self Care  Discharge Instructions    Diet - low sodium heart healthy    Complete by:  As directed      Discharge instructions    Complete by:  As directed   Call for severe HA or BP greater than 150/105            Medication List    TAKE these medications        [employer] PRENATAL GUMMY PO  Take 2 tablets by mouth daily.     ibuprofen 600 MG tablet  Commonly known as:   ADVIL,MOTRIN  Take 1 tablet (600 mg total) by mouth every 6 (six) hours.     labetalol 100 MG tablet  Commonly known as:  NORMODYNE  Take 2 tablets (200 mg total) by mouth 2 (two) times daily.     oxyCODONE-acetaminophen 5-325 MG per tablet  Commonly known as:  PERCOCET/ROXICET  Take 2 tablets by mouth every 4 (four) hours as needed for moderate pain or severe pain (pain).           Follow-up Information    Follow up with Cassandra PilaICHARDSON,Lashan Gluth W, MD. Schedule an appointment as soon as possible for a visit in 1 week.   Specialty:  Obstetrics and Gynecology   Why:  BP check in the office   Contact information:   [address] [place] [id number] [phone]       Signed: Oliver Rich,Cassandra Rich 02/27/2015, 9:06 AM

## 2015-02-27 NOTE — Progress Notes (Signed)
1300 - Patient transported by NT via w/c to main entrance for discharge.  Patient stable at time of discharge.

## 2015-02-27 NOTE — Progress Notes (Signed)
AVS reviewed with patient.  Verbalized understanding of discharge instructions, physician follow-up, and medications.  Patient's states back pain 6/10.  States that she just needs to "get home to her own bed and out of this hospital bed."  Patient's IV removed.  Site WNL.  Patient reports all belongings intact and possession.

## 2015-05-30 ENCOUNTER — Emergency Department (HOSPITAL_COMMUNITY): Payer: No Typology Code available for payment source

## 2015-05-30 ENCOUNTER — Encounter (HOSPITAL_COMMUNITY): Payer: Self-pay

## 2015-05-30 ENCOUNTER — Emergency Department (HOSPITAL_COMMUNITY)
Admission: EM | Admit: 2015-05-30 | Discharge: 2015-05-30 | Disposition: A | Discharge status: Home / Self Care | Payer: No Typology Code available for payment source | Attending: Emergency Medicine | Admitting: Emergency Medicine

## 2015-05-30 DIAGNOSIS — S161XXA Strain of muscle, fascia and tendon at neck level, initial encounter: Secondary | ICD-10-CM | POA: Insufficient documentation

## 2015-05-30 DIAGNOSIS — Z79899 Other long term (current) drug therapy: Secondary | ICD-10-CM | POA: Insufficient documentation

## 2015-05-30 DIAGNOSIS — S39012A Strain of muscle, fascia and tendon of lower back, initial encounter: Secondary | ICD-10-CM | POA: Diagnosis not present

## 2015-05-30 DIAGNOSIS — Y9389 Activity, other specified: Secondary | ICD-10-CM | POA: Diagnosis not present

## 2015-05-30 DIAGNOSIS — Y92410 Unspecified street and highway as the place of occurrence of the external cause: Secondary | ICD-10-CM | POA: Insufficient documentation

## 2015-05-30 DIAGNOSIS — Z791 Long term (current) use of non-steroidal anti-inflammatories (NSAID): Secondary | ICD-10-CM | POA: Insufficient documentation

## 2015-05-30 DIAGNOSIS — Y999 Unspecified external cause status: Secondary | ICD-10-CM | POA: Diagnosis not present

## 2015-05-30 DIAGNOSIS — I10 Essential (primary) hypertension: Secondary | ICD-10-CM | POA: Diagnosis not present

## 2015-05-30 DIAGNOSIS — S3992XA Unspecified injury of lower back, initial encounter: Secondary | ICD-10-CM | POA: Diagnosis present

## 2015-05-30 DIAGNOSIS — Z8619 Personal history of other infectious and parasitic diseases: Secondary | ICD-10-CM | POA: Insufficient documentation

## 2015-05-30 MED ORDER — MORPHINE SULFATE (PF) 4 MG/ML IV SOLN: 4.0000 mg | Freq: Once | INTRAVENOUS | Status: DC

## 2015-05-30 MED ORDER — CYCLOBENZAPRINE HCL 10 MG PO TABS: 10.0000 mg | ORAL_TABLET | Freq: Three times a day (TID) | ORAL | Status: AC | PRN
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
## 2015-05-30 NOTE — ED Notes (Signed)
MD at bedside. 
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
